# Patient Record
Sex: Female | Born: 1965 | Race: White | Hispanic: No | Smoking: Never smoker
Health system: Southern US, Community
[De-identification: ages and names within clinical notes are randomized; demographics above are authoritative.]

## PROBLEM LIST (undated history)

## (undated) DIAGNOSIS — O24419 Gestational diabetes mellitus in pregnancy, unspecified control: Secondary | ICD-10-CM

## (undated) DIAGNOSIS — I10 Essential (primary) hypertension: Secondary | ICD-10-CM

## (undated) DIAGNOSIS — R7301 Impaired fasting glucose: Secondary | ICD-10-CM

## (undated) DIAGNOSIS — F419 Anxiety disorder, unspecified: Secondary | ICD-10-CM

## (undated) DIAGNOSIS — E785 Hyperlipidemia, unspecified: Secondary | ICD-10-CM

## (undated) DIAGNOSIS — F32A Depression, unspecified: Secondary | ICD-10-CM

## (undated) DIAGNOSIS — T7840XA Allergy, unspecified, initial encounter: Secondary | ICD-10-CM

## (undated) DIAGNOSIS — F329 Major depressive disorder, single episode, unspecified: Secondary | ICD-10-CM

## (undated) HISTORY — DX: Allergy, unspecified, initial encounter: T78.40XA

## (undated) HISTORY — DX: Impaired fasting glucose: R73.01

## (undated) HISTORY — DX: Anxiety disorder, unspecified: F41.9

## (undated) HISTORY — DX: Depression, unspecified: F32.A

## (undated) HISTORY — DX: Gestational diabetes mellitus in pregnancy, unspecified control: O24.419

## (undated) HISTORY — DX: Hyperlipidemia, unspecified: E78.5

## (undated) HISTORY — PX: BLADDER SUSPENSION: SHX72

## (undated) HISTORY — DX: Essential (primary) hypertension: I10

## (undated) HISTORY — DX: Major depressive disorder, single episode, unspecified: F32.9

---

## 1987-02-18 HISTORY — PX: OTHER SURGICAL HISTORY: SHX169

## 2006-01-30 ENCOUNTER — Ambulatory Visit (HOSPITAL_COMMUNITY): Admission: RE | Admit: 2006-01-30 | Discharge: 2006-01-30 | Payer: Self-pay | Admitting: Family Medicine

## 2007-02-08 ENCOUNTER — Ambulatory Visit (HOSPITAL_COMMUNITY): Admission: RE | Admit: 2007-02-08 | Discharge: 2007-02-08 | Payer: Self-pay | Admitting: Family Medicine

## 2008-03-06 ENCOUNTER — Ambulatory Visit (HOSPITAL_COMMUNITY): Admission: RE | Admit: 2008-03-06 | Discharge: 2008-03-06 | Payer: Self-pay | Admitting: Family Medicine

## 2009-04-13 ENCOUNTER — Encounter: Admission: RE | Admit: 2009-04-13 | Discharge: 2009-04-13 | Payer: Self-pay | Admitting: Family Medicine

## 2010-03-10 ENCOUNTER — Encounter: Payer: Self-pay | Admitting: Family Medicine

## 2010-03-14 ENCOUNTER — Other Ambulatory Visit: Payer: Self-pay

## 2010-03-14 DIAGNOSIS — Z1239 Encounter for other screening for malignant neoplasm of breast: Secondary | ICD-10-CM

## 2010-04-18 ENCOUNTER — Ambulatory Visit: Payer: Self-pay

## 2010-05-28 ENCOUNTER — Ambulatory Visit: Payer: Self-pay

## 2010-05-29 ENCOUNTER — Other Ambulatory Visit: Payer: Self-pay | Admitting: Family Medicine

## 2010-05-29 ENCOUNTER — Other Ambulatory Visit: Payer: Self-pay | Admitting: Obstetrics and Gynecology

## 2010-05-29 ENCOUNTER — Ambulatory Visit: Payer: Self-pay

## 2010-05-29 DIAGNOSIS — Z1239 Encounter for other screening for malignant neoplasm of breast: Secondary | ICD-10-CM

## 2010-05-31 ENCOUNTER — Ambulatory Visit
Admission: RE | Admit: 2010-05-31 | Discharge: 2010-05-31 | Disposition: A | Discharge status: Home / Self Care | Payer: BC Managed Care – PPO | Source: Ambulatory Visit

## 2010-05-31 DIAGNOSIS — Z1239 Encounter for other screening for malignant neoplasm of breast: Secondary | ICD-10-CM

## 2010-12-14 ENCOUNTER — Encounter: Payer: Self-pay | Admitting: *Deleted

## 2010-12-14 ENCOUNTER — Emergency Department (HOSPITAL_BASED_OUTPATIENT_CLINIC_OR_DEPARTMENT_OTHER)
Admission: EM | Admit: 2010-12-14 | Discharge: 2010-12-14 | Disposition: A | Discharge status: Home / Self Care | Payer: BC Managed Care – PPO | Attending: Emergency Medicine | Admitting: Emergency Medicine

## 2010-12-14 DIAGNOSIS — IMO0002 Reserved for concepts with insufficient information to code with codable children: Secondary | ICD-10-CM | POA: Insufficient documentation

## 2010-12-14 DIAGNOSIS — X503XXA Overexertion from repetitive movements, initial encounter: Secondary | ICD-10-CM | POA: Insufficient documentation

## 2010-12-14 DIAGNOSIS — M5416 Radiculopathy, lumbar region: Secondary | ICD-10-CM

## 2010-12-14 DIAGNOSIS — S335XXA Sprain of ligaments of lumbar spine, initial encounter: Secondary | ICD-10-CM | POA: Insufficient documentation

## 2010-12-14 DIAGNOSIS — S39012A Strain of muscle, fascia and tendon of lower back, initial encounter: Secondary | ICD-10-CM

## 2010-12-14 DIAGNOSIS — J45909 Unspecified asthma, uncomplicated: Secondary | ICD-10-CM | POA: Insufficient documentation

## 2010-12-14 MED ORDER — OXYCODONE-ACETAMINOPHEN 5-325 MG PO TABS
1.0000 | ORAL_TABLET | Freq: Once | ORAL | Status: AC
  Administered 2010-12-14: 1 via ORAL
  Filled 2010-12-14: qty 1

## 2010-12-14 MED ORDER — OXYCODONE-ACETAMINOPHEN 5-325 MG PO TABS: ORAL_TABLET | ORAL | Status: DC

## 2010-12-14 MED ORDER — PREDNISONE 50 MG PO TABS: 50.0000 mg | ORAL_TABLET | Freq: Every day | ORAL | Status: AC

## 2010-12-14 MED ORDER — PREDNISONE 50 MG PO TABS
60.0000 mg | ORAL_TABLET | Freq: Once | ORAL | Status: AC
  Administered 2010-12-14: 60 mg via ORAL
  Filled 2010-12-14: qty 1

## 2010-12-14 NOTE — ED Provider Notes (Signed)
History     CSN: [id number] Arrival date & time: 12/14/2010  6:54 PM   First MD Initiated Contact with Patient 12/14/10 1915      Chief Complaint  Patient presents with  . Back Pain    (Consider location/radiation/quality/duration/timing/severity/associated sxs/prior treatment) HPI Comments: Pt injured her lower back ~ 6 weeks ago.  She went through therapy and was feeling much better./  She was lifting and pushing heavy objects around at home and has had acute lower back pain since then.  Patient is a 45 y.o. female presenting with back pain. The history is provided by the patient. No language interpreter was used.  Back Pain  This is a new problem. The current episode started 6 to 12 hours ago. The problem occurs constantly. The problem has not changed since onset.The pain is associated with lifting heavy objects. The pain is present in the lumbar spine. The quality of the pain is described as aching and shooting. Radiates to: R buttock. The pain is severe. The symptoms are aggravated by bending, twisting and certain positions. She has tried NSAIDs, muscle relaxants and analgesics for the symptoms. The treatment provided mild relief.    Past Medical History  Diagnosis Date  . Asthma     Past Surgical History  Procedure Date  . Bladder suspension     No family history on file.  History  Substance Use Topics  . Smoking status: Never Smoker   . Smokeless tobacco: Not on file  . Alcohol Use: Yes     rare    OB History    Grav Para Term Preterm Abortions TAB SAB Ect Mult Living                  Review of Systems  Musculoskeletal: Positive for back pain.  All other systems reviewed and are negative.    Allergies  Darvocet and Penicillins cross reactors  Home Medications   Current Outpatient Rx  Name Route Sig Dispense Refill  . ASCORBIC ACID 250 MG PO CHEW Oral Chew 250 mg by mouth daily.      . FENOFIBRATE 150 MG PO CAPS Oral Take 1 capsule by mouth daily.       Marland Kitchen HYDROCODONE-ACETAMINOPHEN PO Oral Take 1 tablet by mouth every 6 (six) hours as needed. For pain     . TORADOL IJ Injection Inject as directed once. Received at urgent care today     . LOSARTAN POTASSIUM-HCTZ 50-12.5 MG PO TABS Oral Take 1 tablet by mouth daily.      . MELOXICAM 15 MG PO TABS Oral Take 15 mg by mouth daily as needed. For pain     . METFORMIN HCL ER (MOD) 500 MG PO TB24 Oral Take 500 mg by mouth daily with breakfast.      . ONE-DAILY MULTI VITAMINS PO TABS Oral Take 1 tablet by mouth daily.      . VENLAFAXINE HCL 75 MG PO TABS Oral Take 225 mg by mouth daily.      Marland Kitchen VITAMIN D (ERGOCALCIFEROL) 50000 UNITS PO CAPS Oral Take 50,000 Units by mouth 2 (two) times a week. Take on Tuesday and Friday       BP 188/94  Pulse 117  Temp(Src) 98.4 F (36.9 C) (Oral)  Resp 22  Ht 5\' 2"  (1.575 m)  Wt 150 lb (68.04 kg)  BMI 27.44 kg/m2  SpO2 100%  LMP 11/24/2010  Physical Exam  Nursing note and vitals reviewed. Constitutional: She is oriented to person,  place, and time. She appears well-developed and well-nourished. No distress.  HENT:  Head: Normocephalic and atraumatic.  Eyes: EOM are normal.  Neck: Normal range of motion.  Cardiovascular: Normal rate, regular rhythm and normal heart sounds.   Pulmonary/Chest: Effort normal and breath sounds normal.  Abdominal: Soft. She exhibits no distension. There is no tenderness.  Musculoskeletal:       Lumbar back: She exhibits decreased range of motion and pain. She exhibits no tenderness, no bony tenderness, no swelling, no edema, no deformity, no laceration and no spasm.       Back:  Neurological: She is alert and oriented to person, place, and time.  Skin: Skin is warm and dry.  Psychiatric: She has a normal mood and affect. Judgment normal.    ED Course  Procedures (including critical care time)  Labs Reviewed - No data to display No results found.   No diagnosis found.    MDM          Worthy Rancher,  PA 12/14/10 2224

## 2010-12-14 NOTE — ED Notes (Signed)
Hx of back problems- lifted today and reinjured back- went to urgent care and got toradol shot without relief- alsao took hydrocodone and muscle relaxer without relief

## 2010-12-14 NOTE — ED Provider Notes (Signed)
Medical screening examination/treatment/procedure(s) were performed by non-physician practitioner and as supervising physician I was immediately available for consultation/collaboration.  Merced Hanners, MD 12/14/10 2356 

## 2011-05-20 ENCOUNTER — Other Ambulatory Visit: Payer: Self-pay | Admitting: Family Medicine

## 2011-05-20 DIAGNOSIS — Z1231 Encounter for screening mammogram for malignant neoplasm of breast: Secondary | ICD-10-CM

## 2011-06-02 ENCOUNTER — Ambulatory Visit
Admission: RE | Admit: 2011-06-02 | Discharge: 2011-06-02 | Disposition: A | Discharge status: Home / Self Care | Payer: BC Managed Care – PPO | Source: Ambulatory Visit | Attending: Family Medicine | Admitting: Family Medicine

## 2011-06-02 DIAGNOSIS — Z1231 Encounter for screening mammogram for malignant neoplasm of breast: Secondary | ICD-10-CM

## 2011-09-13 ENCOUNTER — Emergency Department (HOSPITAL_BASED_OUTPATIENT_CLINIC_OR_DEPARTMENT_OTHER)
Admission: EM | Admit: 2011-09-13 | Discharge: 2011-09-13 | Disposition: A | Discharge status: Home / Self Care | Payer: BC Managed Care – PPO | Attending: Emergency Medicine | Admitting: Emergency Medicine

## 2011-09-13 ENCOUNTER — Encounter (HOSPITAL_BASED_OUTPATIENT_CLINIC_OR_DEPARTMENT_OTHER): Payer: Self-pay | Admitting: *Deleted

## 2011-09-13 DIAGNOSIS — T63481A Toxic effect of venom of other arthropod, accidental (unintentional), initial encounter: Secondary | ICD-10-CM | POA: Insufficient documentation

## 2011-09-13 DIAGNOSIS — T6391XA Toxic effect of contact with unspecified venomous animal, accidental (unintentional), initial encounter: Secondary | ICD-10-CM | POA: Insufficient documentation

## 2011-09-13 NOTE — ED Provider Notes (Signed)
History     CSN: [id number]  Arrival date & time 09/13/11  5784   First MD Initiated Contact with Patient 09/13/11 2021      Chief Complaint  Patient presents with  . Insect Bite    (Consider location/radiation/quality/duration/timing/severity/associated sxs/prior treatment) The history is provided by the patient.   Patient presents to emergency department complaining of unknown insect sting to her posterior right thigh just prior to arrival. Patient states that she was walking through the grass when she felt a burning sharp pain in the posterior aspect of the right side. Patient states that she went inside and saw a red circular area of redness. Patient states her concern was "what if a black widow spider or a brown recluse spider bit me? How would I know?" Patient did not see the actual insect that bit her. Patient has no  history of systemic allergic reactions to insect stings. Patient took Benadryl prior to arrival with some improvement and he stinging pain. Symptoms are acute onset, ongoing, and improved. She denies aggravating factors.  Past Medical History  Diagnosis Date  . Asthma     Past Surgical History  Procedure Date  . Bladder suspension     History reviewed. No pertinent family history.  History  Substance Use Topics  . Smoking status: Never Smoker   . Smokeless tobacco: Not on file  . Alcohol Use: Yes     rare    OB History    Grav Para Term Preterm Abortions TAB SAB Ect Mult Living                  Review of Systems  All other systems reviewed and are negative.    Allergies  Darvocet and Penicillins cross reactors  Home Medications   Current Outpatient Rx  Name Route Sig Dispense Refill  . ASCORBIC ACID 250 MG PO CHEW Oral Chew 250 mg by mouth daily.      . FENOFIBRATE 150 MG PO CAPS Oral Take 1 capsule by mouth daily.      Marland Kitchen HYDROCODONE-ACETAMINOPHEN PO Oral Take 1 tablet by mouth every 6 (six) hours as needed. For pain     . TORADOL IJ  Injection Inject as directed once. Received at urgent care today     . LOSARTAN POTASSIUM-HCTZ 50-12.5 MG PO TABS Oral Take 1 tablet by mouth daily.      . MELOXICAM 15 MG PO TABS Oral Take 15 mg by mouth daily as needed. For pain     . METFORMIN HCL ER (MOD) 500 MG PO TB24 Oral Take 500 mg by mouth daily with breakfast.      . ONE-DAILY MULTI VITAMINS PO TABS Oral Take 1 tablet by mouth daily.      . OXYCODONE-ACETAMINOPHEN 5-325 MG PO TABS  One tab po q 4-6 hrs prnpain 20 tablet 0  . VENLAFAXINE HCL 75 MG PO TABS Oral Take 225 mg by mouth daily.      Marland Kitchen VITAMIN D (ERGOCALCIFEROL) 50000 UNITS PO CAPS Oral Take 50,000 Units by mouth 2 (two) times a week. Take on Tuesday and Friday       BP 131/74  Pulse 76  Temp 98.8 F (37.1 C) (Oral)  Resp 18  Ht 5\' 2"  (1.575 m)  Wt 150 lb (68.04 kg)  BMI 27.44 kg/m2  SpO2 99%  LMP 08/21/2011  Physical Exam  Constitutional: She is oriented to person, place, and time. She appears well-developed and well-nourished. No distress.  HENT:  Head: Normocephalic and atraumatic.       No facial swelling. Patent airway.   Eyes: Conjunctivae are normal.  Cardiovascular: Normal rate and regular rhythm.   Pulmonary/Chest: Effort normal. No respiratory distress. She has no wheezes. She has no rales. She exhibits no tenderness.  Musculoskeletal: Normal range of motion. She exhibits no edema and no tenderness.       Right ankle: She exhibits swelling. tenderness.       Swelling and TTP of right lateral ankle but no TTP or swelling of fore foot or calf. No break in skin. Good pedal pulse and cap refill of all toes. Wiggling toes without difficulty.   Neurological: She is alert and oriented to person, place, and time.       Normal sensation of entire foot.   Skin: Skin is warm and dry. No rash noted. She is not diaphoretic. There is erythema. No pallor.       Quarter sized area of faint erythema of posterior thigh with no break in skin or visible stinger. No  induration or fluctuance.   Psychiatric: She has a normal mood and affect. Her behavior is normal.    ED Course  Procedures (including critical care time)  Labs Reviewed - No data to display No results found.   1. Insect sting       MDM  Localized skin reaction to unknown insect sting with no systemic allergic reaction. Spoke at length with patient about changing or worsening symptoms to include signs of infection versus systemic allergic reaction and reasons to return to the emergency department. Patient voices her understanding and is agreeable plan.  I reassured patient of the low likelihood of a brown recluse spider or black widow to crawl up her leg when walking in open grass, bite her and drop without visualizing spider but gave strict precautions for return for worsening of symptoms.         [place], [place] 09/13/11 2051

## 2011-09-13 NOTE — ED Notes (Signed)
Pt states she was walking and felt a sharp pain in her right thigh area. Red circular area (approx 6 cm in diameter) noted.

## 2011-09-13 NOTE — ED Notes (Signed)
Quarter sized area of redness noted on the R hamstring where Pt. Reports she may have been bitten or stung by an insect today.the patient. Reports she was walking outside of the house and she has multiple spiders outside where she was walking.  Pt. Reports not seeing the insect that poss. Bit her.

## 2011-09-14 NOTE — ED Provider Notes (Signed)
Medical screening examination/treatment/procedure(s) were performed by non-physician practitioner and as supervising physician I was immediately available for consultation/collaboration.  Darien Kading, MD 09/14/11 0856 

## 2012-05-17 ENCOUNTER — Encounter: Payer: Self-pay | Admitting: Family Medicine

## 2012-05-17 ENCOUNTER — Ambulatory Visit (INDEPENDENT_AMBULATORY_CARE_PROVIDER_SITE_OTHER): Payer: BC Managed Care – PPO | Admitting: Family Medicine

## 2012-05-17 VITALS — BP 145/84 | HR 65 | Ht 62.5 in | Wt 153.0 lb

## 2012-05-17 DIAGNOSIS — E785 Hyperlipidemia, unspecified: Secondary | ICD-10-CM | POA: Insufficient documentation

## 2012-05-17 DIAGNOSIS — F32A Depression, unspecified: Secondary | ICD-10-CM

## 2012-05-17 DIAGNOSIS — F329 Major depressive disorder, single episode, unspecified: Secondary | ICD-10-CM

## 2012-05-17 DIAGNOSIS — R41840 Attention and concentration deficit: Secondary | ICD-10-CM

## 2012-05-17 DIAGNOSIS — F3289 Other specified depressive episodes: Secondary | ICD-10-CM

## 2012-05-17 DIAGNOSIS — R7301 Impaired fasting glucose: Secondary | ICD-10-CM

## 2012-05-17 DIAGNOSIS — I10 Essential (primary) hypertension: Secondary | ICD-10-CM | POA: Insufficient documentation

## 2012-05-17 MED ORDER — OMEGA-3-ACID ETHYL ESTERS 1 G PO CAPS: 2.0000 g | ORAL_CAPSULE | Freq: Two times a day (BID) | ORAL | Status: DC

## 2012-05-17 MED ORDER — PRAVASTATIN SODIUM 20 MG PO TABS: 20.0000 mg | ORAL_TABLET | Freq: Every day | ORAL | Status: DC

## 2012-05-17 MED ORDER — FENOFIBRATE 120 MG PO TABS: 1.0000 | ORAL_TABLET | Freq: Every day | ORAL | Status: DC

## 2012-05-17 MED ORDER — METHYLPHENIDATE HCL ER (OSM) 27 MG PO TBCR: 27.0000 mg | EXTENDED_RELEASE_TABLET | Freq: Every day | ORAL | Status: DC

## 2012-05-17 MED ORDER — LOSARTAN POTASSIUM-HCTZ 50-12.5 MG PO TABS: 1.0000 | ORAL_TABLET | Freq: Every day | ORAL | Status: DC

## 2012-05-17 MED ORDER — VITAMIN D (ERGOCALCIFEROL) 1.25 MG (50000 UNIT) PO CAPS: ORAL_CAPSULE | ORAL | Status: DC

## 2012-05-17 NOTE — Progress Notes (Signed)
  1)  Subjective:    Patient ID: Melanie Webb, female    DOB: 1965/10/16, 47 y.o.   MRN: [id number]  HPI :  Melanie Webb is here today to go over her lab and for medication refills.  She became more depressed after her last visit so went ahead and started taking the Prozac prescription she had been given.  Overall, she feels that her mood is improved but at times she feels that she has more of a "whatever" attitude.  She feels that she needs to have more happiness in her life.  She has just joined a gym and is hoping that exercise will make her feel better.  She also feels that she has been having trouble with her memory and concentration which concerns her.      Review of Systems  Psychiatric/Behavioral: Positive for dysphoric mood and decreased concentration.       Objective:   Physical Exam  Constitutional: She appears well-nourished. She appears distressed (appear to be mild ).  Neck: No thyromegaly present.  Cardiovascular: Normal rate, regular rhythm and normal heart sounds.   Pulmonary/Chest: Effort normal and breath sounds normal.  Psychiatric: Her behavior is normal. Judgment and thought content normal.  She appears to be down and was tearful at times.          Assessment & Plan:    1)  Depression/Concentration:  We had a long discussion about this and how we could best treat the combination.  One option was to add Wellbutrin to the Prozac.  We looking back and noted that Melanie Webb has taking Wellbutrin in the past.  She did not have favorable results with it and recalls that it made her a little "mean".   We decided that since a bigger issue is her concentration, we'll try her on a combination of Concerta and Prozac.  To decrease that "whatever" attitude, she is going to take her Prozac every other day.    2)  Hyperlipidemia:  Her lipid panel is perfect on the combination of pravastatin, Lovaza and Fenoglide.    3)  Hypertension:  Her BP is a little elevated today but she is  out of her Hyzaar.    4)  Impaired Fasting Glucose:  Her FBS is WNL on metformin.

## 2012-05-17 NOTE — Patient Instructions (Addendum)
1)  Mood - Take your Prozac daily - every other day.  Depending on your mood, you may want to take more during the second 1/2 of your cycle when PMS is worse.    2)  Concentration:  Take the Concerta 27 mg in the am.      Calorie Counting Diet A calorie counting diet requires you to eat the number of calories that are right for you in a day. Calories are the measurement of how much energy you get from the food you eat. Eating the right amount of calories is important for staying at a healthy weight. If you eat too many calories, your body will store them as fat and you may gain weight. If you eat too few calories, you may lose weight. Counting the number of calories you eat during a day will help you know if you are eating the right amount. A Registered Dietitian can determine how many calories you need in a day. The amount of calories needed varies from person to person. If your goal is to lose weight, you will need to eat fewer calories. Losing weight can benefit you if you are overweight or have health problems such as heart disease, high blood pressure, or diabetes. If your goal is to gain weight, you will need to eat more calories. Gaining weight may be necessary if you have a certain health problem that causes your body to need more energy. TIPS Whether you are increasing or decreasing the number of calories you eat during a day, it may be hard to get used to changes in what you eat and drink. The following are tips to help you keep track of the number of calories you eat.  Measure foods at home with measuring cups. This helps you know the amount of food and number of calories you are eating.  Restaurants often serve food in amounts that are larger than 1 serving. While eating out, estimate how many servings of a food you are given. For example, a serving of cooked rice is  cup or about the size of half of a fist. Knowing serving sizes will help you be aware of how much food you are eating at  restaurants.  Ask for smaller portion sizes or child-size portions at restaurants.  Plan to eat half of a meal at a restaurant. Take the rest home or share the other half with a friend.  Read the Nutrition Facts panel on food labels for calorie content and serving size. You can find out how many servings are in a package, the size of a serving, and the number of calories each serving has.  For example, a package might contain 3 cookies. The Nutrition Facts panel on that package says that 1 serving is 1 cookie. Below that, it will say there are 3 servings in the container. The calories section of the Nutrition Facts label says there are 90 calories. This means there are 90 calories in 1 cookie (1 serving). If you eat 1 cookie you have eaten 90 calories. If you eat all 3 cookies, you have eaten 270 calories (3 servings x 90 calories = 270 calories). The list below tells you how big or small some common portion sizes are.  1 oz.........4 stacked dice.  3 oz........Marland KitchenDeck of cards.  1 tsp.......Marland KitchenTip of little finger.  1 tbs......Marland KitchenMarland KitchenThumb.  2 tbs.......Marland KitchenGolf ball.   cup......Marland KitchenHalf of a fist.  1 cup.......Marland KitchenA fist. KEEP A FOOD LOG Write down every food item you eat,  the amount you eat, and the number of calories in each food you eat during the day. At the end of the day, you can add up the total number of calories you have eaten. It may help to keep a list like the one below. Find out the calorie information by reading the Nutrition Facts panel on food labels. Breakfast  Bran cereal (1 cup, 110 calories).  Fat-free milk ( cup, 45 calories). Snack  Apple (1 medium, 80 calories). Lunch  Spinach (1 cup, 20 calories).  Tomato ( medium, 20 calories).  Chicken breast strips (3 oz, 165 calories).  Shredded cheddar cheese ( cup, 110 calories).  Light [place] dressing (2 tbs, 60 calories).  Whole-wheat bread (1 slice, 80 calories).  Tub margarine (1 tsp, 35 calories).  Vegetable  soup (1 cup, 160 calories). Dinner  Pork chop (3 oz, 190 calories).  Brown rice (1 cup, 215 calories).  Steamed broccoli ( cup, 20 calories).  Strawberries (1  cup, 65 calories).  Whipped cream (1 tbs, 50 calories). Daily Calorie Total: 1425 Document Released: 02/03/2005 Document Revised: 04/28/2011 Document Reviewed: 07/31/2006 [facility] Patient Information 2013 [place], [place].  Exercise to Lose Weight Exercise and a healthy diet may help you lose weight. Your doctor may suggest specific exercises. EXERCISE IDEAS AND TIPS  Choose low-cost things you enjoy doing, such as walking, bicycling, or exercising to workout videos.  Take stairs instead of the elevator.  Walk during your lunch break.  Park your car further away from work or school.  Go to a gym or an exercise class.  Start with 5 to 10 minutes of exercise each day. Build up to 30 minutes of exercise 4 to 6 days a week.  Wear shoes with good support and comfortable clothes.  Stretch before and after working out.  Work out until you breathe harder and your heart beats faster.  Drink extra water when you exercise.  Do not do so much that you hurt yourself, feel dizzy, or get very short of breath. Exercises that burn about 150 calories:  Running 1  miles in 15 minutes.  Playing volleyball for 45 to 60 minutes.  Washing and waxing a car for 45 to 60 minutes.  Playing touch football for 45 minutes.  Walking 1  miles in 35 minutes.  Pushing a stroller 1  miles in 30 minutes.  Playing basketball for 30 minutes.  Raking leaves for 30 minutes.  Bicycling 5 miles in 30 minutes.  Walking 2 miles in 30 minutes.  Dancing for 30 minutes.  Shoveling snow for 15 minutes.  Swimming laps for 20 minutes.  Walking up stairs for 15 minutes.  Bicycling 4 miles in 15 minutes.  Gardening for 30 to 45 minutes.  Jumping rope for 15 minutes.  Washing windows or floors for 45 to 60 minutes. Document  Released: 03/08/2010 Document Revised: 04/28/2011 Document Reviewed: 03/08/2010 [facility] Patient Information 2013 [place], [place].

## 2012-05-19 DIAGNOSIS — R7301 Impaired fasting glucose: Secondary | ICD-10-CM | POA: Insufficient documentation

## 2012-05-24 ENCOUNTER — Other Ambulatory Visit: Payer: Self-pay | Admitting: Family Medicine

## 2012-05-24 ENCOUNTER — Telehealth: Payer: Self-pay | Admitting: *Deleted

## 2012-05-24 DIAGNOSIS — E559 Vitamin D deficiency, unspecified: Secondary | ICD-10-CM

## 2012-05-24 DIAGNOSIS — I10 Essential (primary) hypertension: Secondary | ICD-10-CM

## 2012-05-24 DIAGNOSIS — E785 Hyperlipidemia, unspecified: Secondary | ICD-10-CM

## 2012-05-24 MED ORDER — VITAMIN D (ERGOCALCIFEROL) 1.25 MG (50000 UNIT) PO CAPS: ORAL_CAPSULE | ORAL | Status: DC

## 2012-05-24 MED ORDER — OMEGA-3-ACID ETHYL ESTERS 1 G PO CAPS: 2.0000 g | ORAL_CAPSULE | Freq: Two times a day (BID) | ORAL | Status: DC

## 2012-05-24 MED ORDER — PRAVASTATIN SODIUM 20 MG PO TABS: 20.0000 mg | ORAL_TABLET | Freq: Every day | ORAL | Status: DC

## 2012-05-24 MED ORDER — LOSARTAN POTASSIUM-HCTZ 50-12.5 MG PO TABS: 1.0000 | ORAL_TABLET | Freq: Every day | ORAL | Status: DC

## 2012-05-24 MED ORDER — FENOFIBRATE 120 MG PO TABS: 1.0000 | ORAL_TABLET | Freq: Every day | ORAL | Status: DC

## 2012-05-24 NOTE — Telephone Encounter (Signed)
PT CALLED SAID SHE SEEN DR. ON [address] AND HER RX'S ARE NOT AT THE PHARMACY- PHARMACY USED [facility] [address]. HIGH BLOOD PRESSURE MED., METPHORMINE, PREVASTATIN. NEEDS REFILLS PT BELIEVES RX FOR VIT D AND ANOTHER ONE TOO. PT ALSO HAS A MOLE SHE WANTS LOOKED AT DOES SHE NEED TO SCHED  WITH DR. OR NEED TO GO TO DERMATOLOGIST?   CALL PT . THANKS Chrisoula Zegarra

## 2012-05-24 NOTE — Telephone Encounter (Signed)
Routed to Dr Alberteen Sam to re-order all her meds. PG

## 2012-05-31 ENCOUNTER — Telehealth: Payer: Self-pay | Admitting: *Deleted

## 2012-05-31 NOTE — Telephone Encounter (Signed)
PT SAID THAT INS. DENIED THE PHARMACY TO BE ABLE TO DO HER REFILL FOR CONCERTA-  NEEDS FOR DR. ZANARD TO PRE AUTHORIZE RX? PHARMACY [facility] [address].

## 2012-06-03 ENCOUNTER — Ambulatory Visit: Payer: BC Managed Care – PPO | Admitting: Family Medicine

## 2012-06-10 NOTE — Telephone Encounter (Signed)
Her concerta was approved until 05/2014. PG

## 2012-06-30 ENCOUNTER — Other Ambulatory Visit: Payer: Self-pay

## 2012-06-30 DIAGNOSIS — Z1231 Encounter for screening mammogram for malignant neoplasm of breast: Secondary | ICD-10-CM

## 2012-07-22 ENCOUNTER — Ambulatory Visit
Admission: RE | Admit: 2012-07-22 | Discharge: 2012-07-22 | Disposition: A | Discharge status: Home / Self Care | Payer: BC Managed Care – PPO | Source: Ambulatory Visit

## 2012-07-22 DIAGNOSIS — Z1231 Encounter for screening mammogram for malignant neoplasm of breast: Secondary | ICD-10-CM

## 2012-08-17 ENCOUNTER — Telehealth: Payer: Self-pay | Admitting: Family Medicine

## 2012-08-17 NOTE — Telephone Encounter (Signed)
I checked with Elease Hashimoto and she stated that at this time we do not have any coupons. I called the patient and let her know.

## 2012-10-20 ENCOUNTER — Encounter: Payer: Self-pay | Admitting: Family Medicine

## 2012-10-20 ENCOUNTER — Ambulatory Visit (INDEPENDENT_AMBULATORY_CARE_PROVIDER_SITE_OTHER): Payer: BC Managed Care – PPO | Admitting: Family Medicine

## 2012-10-20 VITALS — BP 115/75 | HR 67 | Wt 150.0 lb

## 2012-10-20 DIAGNOSIS — E785 Hyperlipidemia, unspecified: Secondary | ICD-10-CM

## 2012-10-20 DIAGNOSIS — R41840 Attention and concentration deficit: Secondary | ICD-10-CM | POA: Insufficient documentation

## 2012-10-20 DIAGNOSIS — Z23 Encounter for immunization: Secondary | ICD-10-CM | POA: Insufficient documentation

## 2012-10-20 DIAGNOSIS — R5381 Other malaise: Secondary | ICD-10-CM

## 2012-10-20 DIAGNOSIS — E559 Vitamin D deficiency, unspecified: Secondary | ICD-10-CM

## 2012-10-20 DIAGNOSIS — R7301 Impaired fasting glucose: Secondary | ICD-10-CM | POA: Insufficient documentation

## 2012-10-20 DIAGNOSIS — F411 Generalized anxiety disorder: Secondary | ICD-10-CM

## 2012-10-20 LAB — POCT GLYCOSYLATED HEMOGLOBIN (HGB A1C): Hemoglobin A1C: 5.4

## 2012-10-20 MED ORDER — METHYLPHENIDATE HCL ER (OSM) 27 MG PO TBCR: 27.0000 mg | EXTENDED_RELEASE_TABLET | Freq: Every day | ORAL | Status: DC

## 2012-10-20 MED ORDER — METHYLPHENIDATE HCL ER (OSM) 18 MG PO TBCR: 18.0000 mg | EXTENDED_RELEASE_TABLET | Freq: Every day | ORAL | Status: DC

## 2012-10-20 MED ORDER — SIMVASTATIN 20 MG PO TABS: 20.0000 mg | ORAL_TABLET | Freq: Every day | ORAL | Status: DC

## 2012-10-20 MED ORDER — FLUOXETINE HCL 20 MG PO TABS: 20.0000 mg | ORAL_TABLET | Freq: Every day | ORAL | Status: DC

## 2012-10-20 MED ORDER — LISDEXAMFETAMINE DIMESYLATE 30 MG PO CAPS: 30.0000 mg | ORAL_CAPSULE | ORAL | Status: DC

## 2012-10-20 NOTE — Assessment & Plan Note (Signed)
Refilled her Prozac.   

## 2012-10-20 NOTE — Progress Notes (Signed)
  Subjective:    Patient ID: Melanie Webb, female    DOB: February 20, 1965, 47 y.o.   MRN: [id number]  HPI  Melanie Webb is here today to discuss the conditions listed below:   1)  Depression:  She is doing very well on her Prozac 20 mg and would like to continue on it.    2)  Vitamin D:  She has been taking Vitamin D 56213 IU 2-3 times per week.    3)  ADD:  She takes her Concerta 27 mg at least once per week and feels that this helps her with her concentration at school.  She needs a refill on it.  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  Her only complaint with the Concerta is that she thinks that it causes her to have a headache.    4)  IFG:  She is doing well on metformin and she would like to continue on it. .    Review of Systems  Constitutional: Negative.   HENT: Negative.   Eyes: Negative.   Respiratory: Negative.   Cardiovascular: Negative.   Gastrointestinal: Negative.   Endocrine: Negative.   Genitourinary: Negative.   Musculoskeletal: Negative.   Skin: Negative.   Allergic/Immunologic: Negative.   Neurological: Negative.   Hematological: Negative.   Psychiatric/Behavioral: Negative.      Past Medical History  Diagnosis Date  . Asthma   . Depression   . Anxiety   . Hypertension   . Hyperlipidemia     Family History  Problem Relation Age of Onset  . Diabetes Mother   . Hyperlipidemia Mother   . Hypertension Mother         Objective:   Physical Exam  Vitals reviewed. Constitutional: She is oriented to person, place, and time. She appears well-developed and well-nourished.  Cardiovascular: Normal rate and regular rhythm.   Pulmonary/Chest: Effort normal and breath sounds normal.  Neurological: She is alert and oriented to person, place, and time.  Skin: Skin is warm and dry.  Psychiatric: She has a normal mood and affect.      Assessment & Plan:  .

## 2012-10-20 NOTE — Assessment & Plan Note (Signed)
She received a flu shot without difficulty.

## 2012-10-20 NOTE — Assessment & Plan Note (Signed)
She is going to compare Concerta 18 mg vs Vyvanse 30 mg.  We'll see if this keeps her from having headaches.

## 2012-10-20 NOTE — Assessment & Plan Note (Signed)
We'll recheck her Vitamin D level at her next blood draw to make sure that her level is not too high.

## 2012-10-20 NOTE — Assessment & Plan Note (Addendum)
Her A1c is perfect at 5.4%. She is going to continue on metformin.  She should have enough medication to carry her to November.

## 2012-10-20 NOTE — Patient Instructions (Addendum)
1)  ADD Medication : Compare Vyvanse 30 mg vs Concerta 18 mg to see which one works the best for you without giving you a headache.    Diabetes, Type 2, Am I At Risk? Diabetes is a lasting (chronic) disease. In type 2 diabetes, the pancreas does not make enough insulin, and the body does not respond normally to the insulin that is made. This type of diabetes was also previously called adult onset diabetes. About 90% of all those who have diabetes have type 2. It usually occurs after the age of 83, but can occur at any age.  People develop type 2 diabetes because they do not use insulin properly. Eventually, the pancreas cannot make enough insulin for the body's needs. Over time, the amount of glucose (sugar) in the blood increases. RISK FACTORS  Overweight  the more weight you have, the more resistant your cells become to insulin.  Family history  you are more likely to get diabetes if a parent or sibling has diabetes.  Race certain races get diabetes more.  African Americans.  American Indians.  Asian Americans.  Hispanics.  Pacific Islander.  Inactive exercise helps control weight and helps your cells be more sensitive to insulin.  Gestational diabetes  some women develop diabetes while they are pregnant. This goes away when they deliver. However, they are 50-60% more likely to develop type 2 diabetes at a later time.  Having a baby over 9 pounds  a sign that you may have had gestational diabetes.  Age the risk of diabetes goes up as you get older, especially after age 25.  High blood pressure (hypertension). SYMPTOMS Many people have no signs or symptoms. Symptoms can be so mild that you might not even notice them. Some of these signs are:  Increased thirst.  Increased hunger.  Tiredness (fatigue).  Increased urination, especially at night.  Weight loss.  Blurred vision.  Sores that do not heal. WHO SHOULD BE TESTED?  Anyone 45 years or older, especially if  overweight, should consider getting tested.  If you are younger than 45, overweight, and have one or more of the risk factors, you should consider getting tested. DIAGNOSIS  Fasting blood glucose (FBS). Usually, 2 are done.  FBS 101-125 mg/dl is considered pre-diabetes.  FBS 126 mg/dl or greater is considered diabetes.  2 hour Oral Glucose Tolerance Test (OGTT). This test is preformed by first having you not eat or drink for several hours. You are then given something sweet to drink and your blood glucose is measured fasting, at one hour and 2 hours. This test tells how well you are able to handle sugars or carbohydrates.  Fasting: 60-100 mg/dl.  1 hour: less than 200 mg/dl.  2 hours: less than 140 mg/dl.  A1c A1c is a blood glucose test that gives and average of your blood glucose over 3 months. It is the accepted method to use to diagnose diabetes.  A1c 5.7-6.4% is considered pre-diabetes.  A1c 6.5% or greater is considered diabetes. WHAT DOES IT MEAN TO HAVE PRE-DIABETES? Pre-diabetes means you are at risk for getting type 2 diabetes. Your blood glucose is higher than normal, but not yet high enough to diagnose diabetes. The good news is, if you have pre-diabetes you can reduce the risk of getting diabetes and even return to normal blood glucose levels. With modest weight loss and moderate physical activity, you can delay or prevent type 2 diabetes.  PREVENTION You cannot do anything about race, age or  family history, but you can lower your chances of getting diabetes. You can:   Exercise regularly and be active.  Reduce fat and calorie intake.  Make wise food choices as much as you can.  Reduce your intake of salt and alcohol.  Maintain a reasonable weight.  Keep blood pressure in an acceptable range. Take medication if needed.  Not smoke.  Maintain an acceptable cholesterol level (HDL, LDL, Triglycerides). Take medication if needed. DOING MY PART: GETTING  STARTED Making big changes in your life is hard, especially if you are faced with more than one change. You can make it easier by taking these steps:  Make a plan to change behavior.  Decide exactly what you will do and when you will do it.  Plan what you need to get ready.  Think about what might prevent you from reaching your goals.  Find family and friends who will support and encourage you.  Decide how you will reward yourself when you do what you have planned.  Your doctor, dietitian, or counselor can help you make a plan. HERE ARE SOME OF THE AREAS YOU MAY WISH TO CHANGE TO REDUCE YOUR RISK OF DIABETES. If you are overweight or obese, choose sensible ways to get in shape. Even small amounts of weight loss, like 5-10 pounds, can help reduce the effects of insulin resistance and help blood glucose control. Diet  Avoid crash diets. Instead, eat less of the foods you usually have. Limit the amount of fat you eat.  Increase your physical activity. Aim for at least 30 minutes of exercise most days of the week.  Set a reasonable weight-loss goal, such as losing 1 pound a week. Aim for a long-term goal of losing 5-7% of your total body weight.  Make wise food choices most of the time.  What you eat has a big impact on your health. By making wise food choices, you can help control your body weight, blood pressure, and cholesterol.  Take a hard look at the serving sizes of the foods you eat. Reduce serving sizes of meat, desserts, and foods high in fat. Increase your intake of fruits and vegetables.  Limit your fat intake to about 25% of your total calories. For example, if your food choices add up to about 2,000 calories a day, try to eat no more than 56 grams of fat. Your caregiver or a dietitian can help you figure out how much fat to have. You can check food labels for fat content too.  You may also want to reduce the number of calories you have each day.  Keep a food log. Write  down what you eat, how much you eat, and anything else that helps keep you on track.  When you meet your goal, reward yourself with a nonfood item or activity. Exercise  Be physically active every day.  Keep and exercise log. Write down what exercise you did, for how long, and anything else that keeps you on track.  Regular exercise (like brisk walking) tackles several risk factors at once. It helps you lose weight, it keeps your cholesterol and blood pressure under control, and it helps your body use insulin. People who are physically active for 30 minutes a day, 5 days a week, reduced their risk of type 2 diabetes. If you are not very active, you should start slowly at first. Talk with your caregiver first about what kinds of exercise would be safe for you. Make a plan to increase your  activity level with the goal of being active for at least 30 minutes a day, most days of the week.  Choose activities you enjoy. Here are some ways to work extra activity into your daily routine:  Take the stairs rather than an elevator or escalator.  Park at the far end of the lot and walk.  Get off the bus a few stops early and walk the rest of the way.  Walk or bicycle instead of drive whenever you can. Medications Some people need medication to help control their blood pressure or cholesterol levels. If you do, take your medicines as directed. Ask your caregiver whether there are any medicines you can take to prevent type 2 diabetes. Document Released: 02/06/2003 Document Revised: 04/28/2011 Document Reviewed: 11/01/2008 [facility] Patient Information 2014 [place], [place].

## 2013-01-03 ENCOUNTER — Other Ambulatory Visit: Payer: Self-pay | Admitting: *Deleted

## 2013-01-03 MED ORDER — METFORMIN HCL 500 MG PO TABS: 500.0000 mg | ORAL_TABLET | Freq: Two times a day (BID) | ORAL | Status: DC

## 2013-01-10 ENCOUNTER — Other Ambulatory Visit: Payer: Self-pay | Admitting: *Deleted

## 2013-01-10 ENCOUNTER — Other Ambulatory Visit: Payer: BC Managed Care – PPO

## 2013-01-10 DIAGNOSIS — R7301 Impaired fasting glucose: Secondary | ICD-10-CM

## 2013-01-10 DIAGNOSIS — E785 Hyperlipidemia, unspecified: Secondary | ICD-10-CM

## 2013-01-10 DIAGNOSIS — R5381 Other malaise: Secondary | ICD-10-CM

## 2013-01-11 ENCOUNTER — Other Ambulatory Visit: Payer: BC Managed Care – PPO

## 2013-01-12 ENCOUNTER — Other Ambulatory Visit: Payer: BC Managed Care – PPO

## 2013-01-12 LAB — CBC WITH DIFFERENTIAL/PLATELET
Basophils Absolute: 0 10*3/uL (ref 0.0–0.1)
Basophils Relative: 0 % (ref 0–1)
Eosinophils Absolute: 0.1 10*3/uL (ref 0.0–0.7)
Eosinophils Relative: 2 % (ref 0–5)
HCT: 38.2 % (ref 36.0–46.0)
Hemoglobin: 13 g/dL (ref 12.0–15.0)
Lymphocytes Relative: 26 % (ref 12–46)
Lymphs Abs: 1.8 10*3/uL (ref 0.7–4.0)
MCH: 29.5 pg (ref 26.0–34.0)
MCHC: 34 g/dL (ref 30.0–36.0)
MCV: 86.6 fL (ref 78.0–100.0)
Monocytes Absolute: 0.4 10*3/uL (ref 0.1–1.0)
Monocytes Relative: 6 % (ref 3–12)
Neutro Abs: 4.5 10*3/uL (ref 1.7–7.7)
Neutrophils Relative %: 66 % (ref 43–77)
Platelets: 306 10*3/uL (ref 150–400)
RBC: 4.41 MIL/uL (ref 3.87–5.11)
RDW: 14.2 % (ref 11.5–15.5)
WBC: 6.8 10*3/uL (ref 4.0–10.5)

## 2013-01-12 LAB — LIPID PANEL
Cholesterol: 157 mg/dL (ref 0–200)
HDL: 57 mg/dL (ref >39)
LDL Cholesterol: 80 mg/dL (ref 0–99)
Total CHOL/HDL Ratio: 2.8 Ratio
Triglycerides: 102 mg/dL (ref <150)
VLDL: 20 mg/dL (ref 0–40)

## 2013-01-12 LAB — COMPLETE METABOLIC PANEL WITH GFR
ALT: 13 U/L (ref 0–35)
AST: 18 U/L (ref 0–37)
Albumin: 4.4 g/dL (ref 3.5–5.2)
Alkaline Phosphatase: 43 U/L (ref 39–117)
BUN: 17 mg/dL (ref 6–23)
CO2: 23 mEq/L (ref 19–32)
Calcium: 9 mg/dL (ref 8.4–10.5)
Chloride: 106 mEq/L (ref 96–112)
Creat: 0.7 mg/dL (ref 0.50–1.10)
GFR, Est African American: >89 mL/min
GFR, Est Non African American: >89 mL/min
Glucose, Bld: 90 mg/dL (ref 70–99)
Potassium: 4.4 mEq/L (ref 3.5–5.3)
Sodium: 138 mEq/L (ref 135–145)
Total Bilirubin: 0.4 mg/dL (ref 0.3–1.2)
Total Protein: 6.8 g/dL (ref 6.0–8.3)

## 2013-01-12 LAB — TSH: TSH: 0.976 u[IU]/mL (ref 0.350–4.500)

## 2013-01-17 ENCOUNTER — Encounter: Payer: Self-pay | Admitting: Family Medicine

## 2013-01-17 ENCOUNTER — Encounter (INDEPENDENT_AMBULATORY_CARE_PROVIDER_SITE_OTHER): Payer: Self-pay

## 2013-01-17 ENCOUNTER — Ambulatory Visit (INDEPENDENT_AMBULATORY_CARE_PROVIDER_SITE_OTHER): Payer: BC Managed Care – PPO | Admitting: Family Medicine

## 2013-01-17 VITALS — BP 130/75 | HR 74 | Resp 16 | Ht 62.5 in | Wt 150.0 lb

## 2013-01-17 DIAGNOSIS — I10 Essential (primary) hypertension: Secondary | ICD-10-CM

## 2013-01-17 DIAGNOSIS — IMO0001 Reserved for inherently not codable concepts without codable children: Secondary | ICD-10-CM

## 2013-01-17 DIAGNOSIS — R41840 Attention and concentration deficit: Secondary | ICD-10-CM

## 2013-01-17 DIAGNOSIS — R7301 Impaired fasting glucose: Secondary | ICD-10-CM

## 2013-01-17 DIAGNOSIS — E785 Hyperlipidemia, unspecified: Secondary | ICD-10-CM

## 2013-01-17 DIAGNOSIS — F411 Generalized anxiety disorder: Secondary | ICD-10-CM

## 2013-01-17 LAB — POCT GLYCOSYLATED HEMOGLOBIN (HGB A1C): Hemoglobin A1C: 5.2

## 2013-01-17 MED ORDER — LOSARTAN POTASSIUM-HCTZ 50-12.5 MG PO TABS: 1.0000 | ORAL_TABLET | Freq: Every day | ORAL | Status: DC

## 2013-01-17 MED ORDER — LISDEXAMFETAMINE DIMESYLATE 30 MG PO CAPS: 30.0000 mg | ORAL_CAPSULE | Freq: Every day | ORAL | Status: DC

## 2013-01-17 MED ORDER — FLUOXETINE HCL 20 MG PO TABS: 20.0000 mg | ORAL_TABLET | Freq: Every day | ORAL | Status: DC

## 2013-01-17 NOTE — Patient Instructions (Addendum)
1)  Blood Sugar - Your A1c is great at 5.2% so you can stop the glucophage.  To use up what you have just take one on a day that you are eating more.     2)  Cholesterol - Stop the simvastatin and the Fenoglide.   3)  Mood - Stay on the Prozac and St. John's Wort for your mood.    4)  Vitamin D - Take 2 per week in the winter and 1 in the summer.  Complementary and Alternative Medical Therapies for Diabetes Complementary and alternative medicines are health care practices or products that are not always accepted as part of routine medicine. Complementary medicine is used along with routine medicine (medical therapy). Alternative medicine can sometimes be used instead of routine medicine. Some people use these methods to treat diabetes. While some of these therapies may be effective, others may not be. Some may even be harmful. Patients using these methods need to tell their caregiver. It is important to let your caregivers know what you are doing. Some of these therapies are discussed below. For more information, talk with your caregiver. THERAPIES Acupuncture Acupuncture is done by a professional who inserts needles into certain points on the skin. Some scientists believe that this triggers the release of the body's natural painkillers. It has been shown to relieve long-term (chronic) pain. This may help patients with painful nerve damage caused by diabetes. Biofeedback Biofeedback helps a person become more aware of the body's response to pain. It also helps you learn to deal with the pain. This alternative therapy focuses on relaxation and stress-reduction techniques. Thinking of peaceful mental images (guided imagery) is one technique. Some people believe these images can ease their condition. MEDICATIONS Chromium Several studies report that chromium supplements may improve diabetes control. Chromium helps insulin improve its action. Research is not yet certain. Supplements have not been  recommended or approved. Caution is needed if you have kidney (renal) problems. Ginseng There are several types of ginseng plants. American ginseng is used for diabetes studies. Those studies have shown some glucose-lowering effects. Those effects have been seen with fasting and after-meal blood glucose levels. They have also been seen in A1c levels (average blood glucose levels over a 78-month period). More long-term studies are needed before recommendations for use of ginseng can be made. Magnesium Experts have studied the relationship between magnesium and diabetes for many years. But it is not yet fully understood. Studies suggest that a low amount of magnesium may make blood glucose control worse in type 2 diabetes. Research also shows that a low amount may contribute to certain diabetes complications. One study showed that people who consume more magnesium had less risk of type 2 diabetes. Eating whole grains, nuts, and green leafy vegetables raises the magnesium level. Vanadium Vanadium is a compound found in tiny amounts in plants and animals. Early studies showed that vanadium improved blood glucose levels in animals with type 1 and type 2 diabetes. One study found that when given vanadium, those with diabetes were able to decrease their insulin dosage. Researchers still need to learn how it works in the body to discover any side effects, and to find safe dosages. Cinnamon There have been a couple of studies that seem to indicate cinnamon decreases insulin resistance and increases insulin production. By doing so, it may lower blood glucose. Exact doses are unknown, but it may work best when used in combination with other diabetes medicines. Document Released: 12/01/2006 Document Revised: 04/28/2011 Document  Reviewed: 12/14/2008 [employer] Patient Information 2014 [place], [place].

## 2013-01-17 NOTE — Progress Notes (Signed)
Subjective:    Patient ID: Melanie Webb, female    DOB: 1965/10/10, 47 y.o.   MRN: [id number]  HPI  Melanie Webb is here today to follow up on her labs and to discuss the conditions listed below:   1)  Depression: She is doing very well on her Prozac 20 mg and would like to continue on it.  She is needing a refill.   2)  Vitamin D: She has been taking Vitamin D 40981 IU 2-3 times per week.   3)  ADD: She is currently taking Concerta. She says that she prefers Vyvanse and would like to go back on it.    4)  IFG: She is doing well on metformin. Her A1C is now 5.2.  5)  Birth Control: She says that she does not want to take any a hormonal form of birth control because she says that they make her crazy. She is wanting to get a tubal and would like a referral to a GYN.   Review of Systems  Constitutional: Negative.   HENT: Negative.   Eyes: Negative.   Respiratory: Negative.   Cardiovascular: Negative.   Gastrointestinal: Negative.   Endocrine: Negative.   Genitourinary: Negative.   Musculoskeletal: Negative.   Skin: Negative.   Allergic/Immunologic: Negative.   Neurological: Negative.   Hematological: Negative.   Psychiatric/Behavioral: Negative.      Past Medical History  Diagnosis Date  . Asthma   . Depression   . Anxiety   . Hypertension   . Hyperlipidemia      Family History  Problem Relation Age of Onset  . Diabetes Mother   . Hyperlipidemia Mother   . Hypertension Mother      History   Social History Narrative   Mrital Status:  Divorced.    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    Tobacco Use/Exposure:  None    Alcohol Use:  Limited    Drug Use:  None   Diet:  Regular   Exercise:  None   Hobbies:  Sewing, Scrapbooking              Objective:   Physical  Exam  Nursing note and vitals reviewed. Constitutional: She is oriented to person, place, and time. She appears well-developed and well-nourished.  HENT:  Head: Normocephalic and atraumatic.  Right Ear: External ear normal.  Left Ear: External ear normal.  Eyes: Conjunctivae and EOM are normal. Pupils are equal, round, and reactive to light.  Neck: Normal range of motion.  Cardiovascular: Normal rate, regular rhythm, normal heart sounds and intact distal pulses.   Pulmonary/Chest: Effort normal and breath sounds normal.  Abdominal: Soft.  Musculoskeletal: Normal range of motion.  Neurological: She is alert and oriented to person, place, and time. She has normal reflexes.  Skin: Skin is warm and dry.  Psychiatric: She has a normal mood and affect. Her behavior is normal. Judgment and thought content normal.      Assessment & Plan:   Melanie Webb was seen today for medication management.  Diagnoses and associated orders for this visit:  Impaired fasting glucose Comments: Her sugar control is very well controlled so she is to hold on the metformin.   - POCT HgB A1C  Anxiety state, unspecified Comments: Refilled her Prozac.  -  FLUoxetine (PROZAC) 20 MG tablet; Take 1 tablet (20 mg total) by mouth daily.  Essential hypertension, benign - losartan-hydrochlorothiazide (HYZAAR) 50-12.5 MG per tablet; Take 1 tablet by mouth daily.  Attention/concentration deficit, non-ADHD Comments: We'll change her back to Vyvanse.    - lisdexamfetamine (VYVANSE) 30 MG capsule; Take 1 capsule (30 mg total) by mouth daily. (Refilled x 3 months)   Other and unspecified hyperlipidemia Comments: Since her lipid panel is so perfect, she is going to hold her cholesterol medications.    TIME SPENT "FACE TO FACE" WITH PATIENT -  45 MINS

## 2013-02-21 ENCOUNTER — Encounter: Payer: Self-pay | Admitting: Family Medicine

## 2013-03-23 ENCOUNTER — Other Ambulatory Visit: Payer: Self-pay | Admitting: Obstetrics and Gynecology

## 2013-03-23 DIAGNOSIS — N6310 Unspecified lump in the right breast, unspecified quadrant: Secondary | ICD-10-CM

## 2013-03-28 ENCOUNTER — Ambulatory Visit
Admission: RE | Admit: 2013-03-28 | Discharge: 2013-03-28 | Disposition: A | Discharge status: Home / Self Care | Payer: BC Managed Care – PPO | Source: Ambulatory Visit | Attending: Obstetrics and Gynecology | Admitting: Obstetrics and Gynecology

## 2013-03-28 ENCOUNTER — Ambulatory Visit
Admission: RE | Admit: 2013-03-28 | Discharge: 2013-03-28 | Disposition: A | Discharge status: Home / Self Care | Payer: Self-pay | Source: Ambulatory Visit | Attending: Obstetrics and Gynecology | Admitting: Obstetrics and Gynecology

## 2013-03-28 DIAGNOSIS — N6310 Unspecified lump in the right breast, unspecified quadrant: Secondary | ICD-10-CM

## 2013-04-05 DIAGNOSIS — I10 Essential (primary) hypertension: Secondary | ICD-10-CM | POA: Insufficient documentation

## 2013-04-05 DIAGNOSIS — IMO0001 Reserved for inherently not codable concepts without codable children: Secondary | ICD-10-CM | POA: Insufficient documentation

## 2013-05-09 IMAGING — MG MM DIGITAL SCREENING BILAT W/ CAD
4 series · 4 of 4 positions shown · non-contrast
Comparison: none

[R CC]
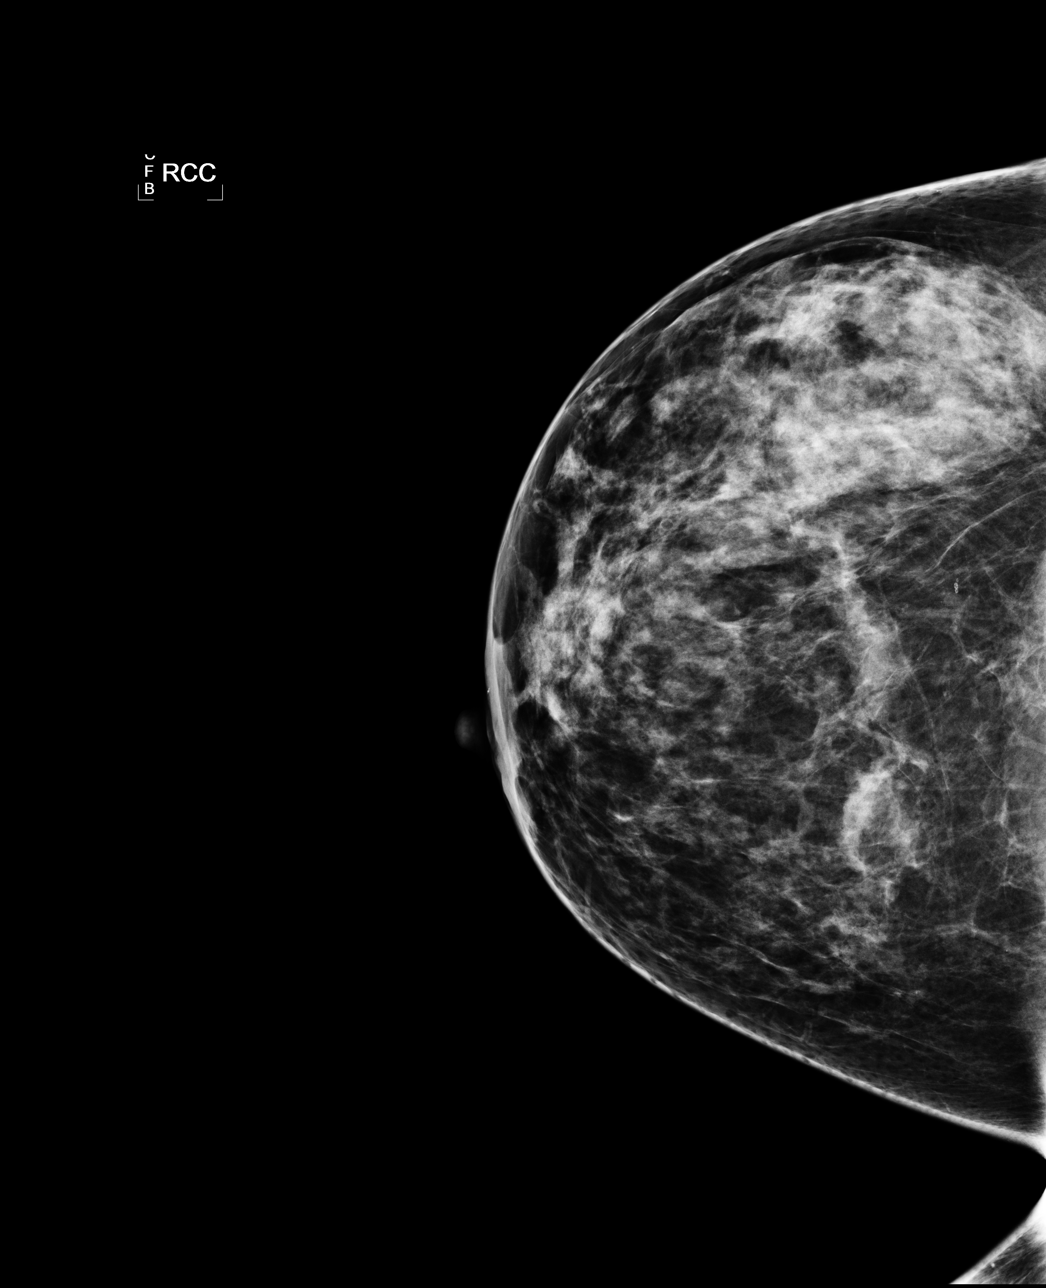

[L CC]
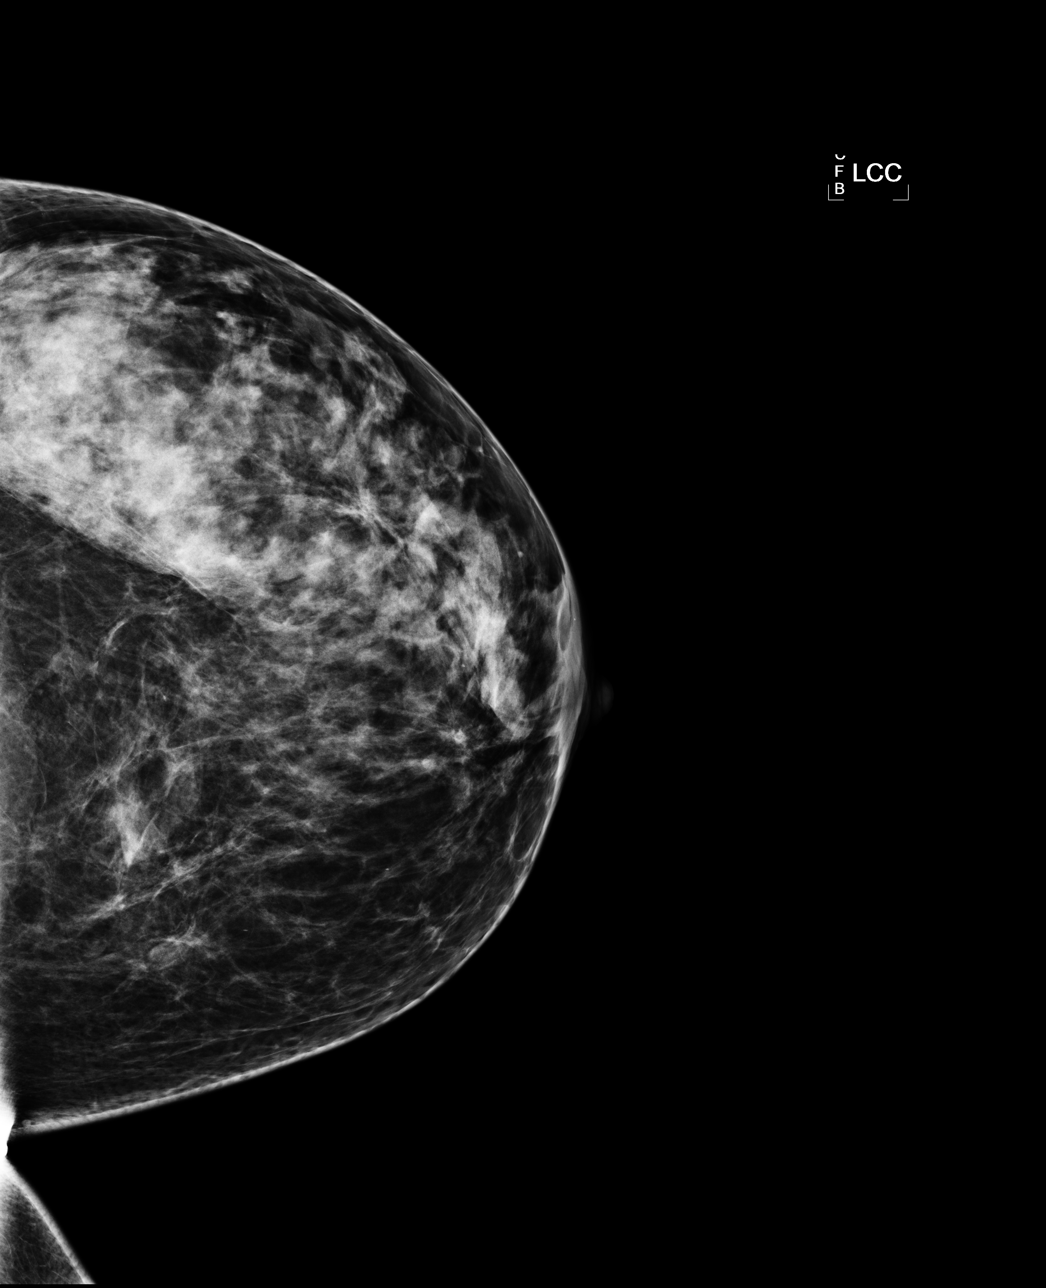

[L MLO]
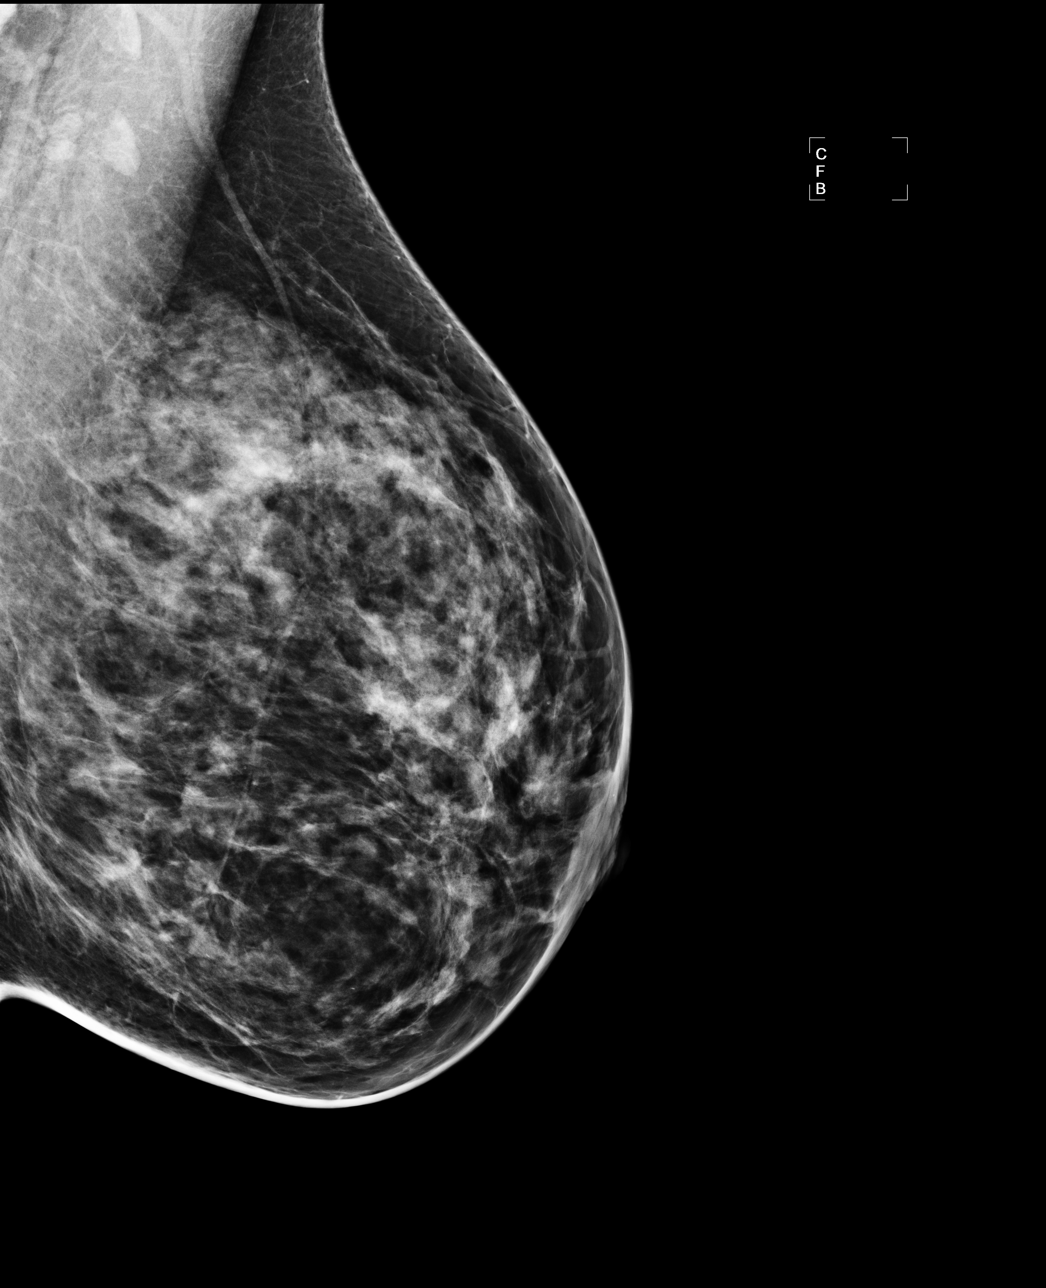

[R MLO]
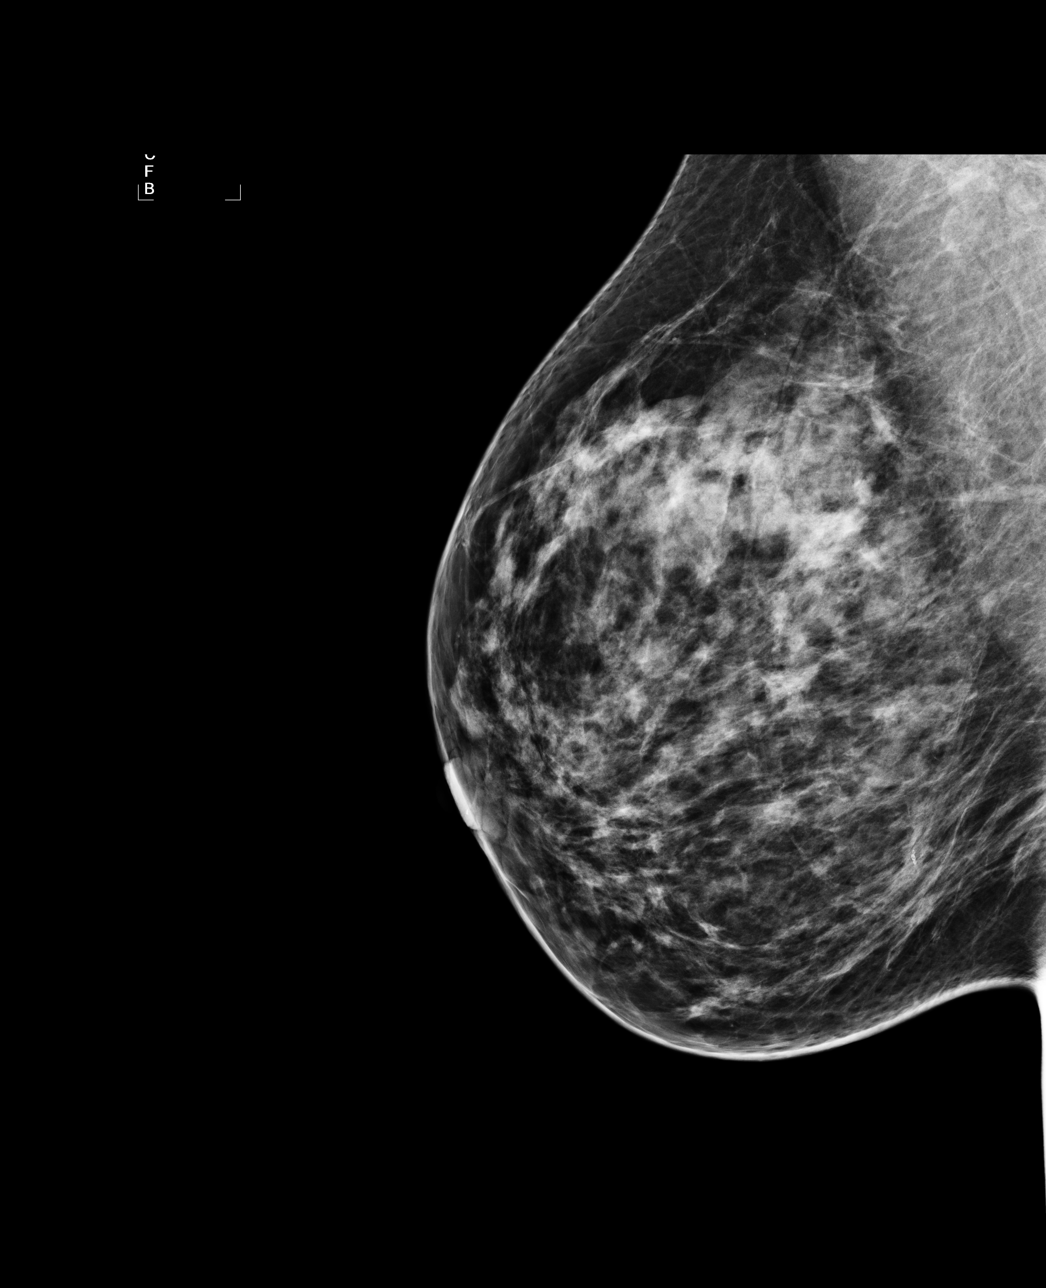

[4 of 4 positions shown; findings below may reference images not displayed]

Canned report from images found in remote index.

Refer to host system for actual result text.

## 2013-06-07 ENCOUNTER — Ambulatory Visit (INDEPENDENT_AMBULATORY_CARE_PROVIDER_SITE_OTHER): Payer: BC Managed Care – PPO | Admitting: Family Medicine

## 2013-06-07 ENCOUNTER — Encounter: Payer: Self-pay | Admitting: Family Medicine

## 2013-06-07 VITALS — BP 129/79 | HR 75 | Resp 16 | Wt 154.0 lb

## 2013-06-07 DIAGNOSIS — IMO0001 Reserved for inherently not codable concepts without codable children: Secondary | ICD-10-CM

## 2013-06-07 DIAGNOSIS — F411 Generalized anxiety disorder: Secondary | ICD-10-CM

## 2013-06-07 DIAGNOSIS — F988 Other specified behavioral and emotional disorders with onset usually occurring in childhood and adolescence: Secondary | ICD-10-CM

## 2013-06-07 DIAGNOSIS — L293 Anogenital pruritus, unspecified: Secondary | ICD-10-CM

## 2013-06-07 DIAGNOSIS — N898 Other specified noninflammatory disorders of vagina: Secondary | ICD-10-CM | POA: Insufficient documentation

## 2013-06-07 MED ORDER — FLUOXETINE HCL 20 MG PO TABS: 20.0000 mg | ORAL_TABLET | Freq: Every day | ORAL | Status: DC

## 2013-06-07 MED ORDER — LISDEXAMFETAMINE DIMESYLATE 30 MG PO CAPS: 30.0000 mg | ORAL_CAPSULE | Freq: Every day | ORAL | Status: DC

## 2013-06-07 MED ORDER — FLUCONAZOLE 200 MG PO TABS: ORAL_TABLET | ORAL | Status: AC

## 2013-06-07 MED ORDER — CLOBETASOL PROPIONATE 0.05 % EX CREA: 1.0000 "application " | TOPICAL_CREAM | Freq: Two times a day (BID) | CUTANEOUS | Status: DC

## 2013-06-07 NOTE — Patient Instructions (Signed)
1)  Vaginal Itching - Take one Diflucan and repeat in a week if needed. You may also apply the clobetasol cream if needed.    2)  Mood/Attention - Stay on your current medications.     Pruritus  Pruritis is an itch. There are many different problems that can cause an itch. Dry skin is one of the most common causes of itching. Most cases of itching do not require medical attention.  HOME CARE INSTRUCTIONS  Make sure your skin is moistened on a regular basis. A moisturizer that contains petroleum jelly is best for keeping moisture in your skin. If you develop a rash, you may try the following for relief:   Use corticosteroid cream.  Apply cool compresses to the affected areas.  Bathe with Epsom salts or baking soda in the bathwater.  Soak in colloidal oatmeal baths. These are available at your pharmacy.  Apply baking soda paste to the rash. Stir water into baking soda until it reaches a paste-like consistency.  Use an anti-itch lotion.  Take over-the-counter diphenhydramine medicine by mouth as the instructions direct.  Avoid scratching. Scratching may cause the rash to become infected. If itching is very bad, your caregiver may suggest prescription lotions or creams to lessen your symptoms.  Avoid hot showers, which can make itching worse. A cold shower may help with itching as long as you use a moisturizer after the shower. SEEK MEDICAL CARE IF: The itching does not go away after several days. Document Released: 10/16/2010 Document Revised: 04/28/2011 Document Reviewed: 10/16/2010 [facility] Patient Information 2014 [place], [place].

## 2013-06-07 NOTE — Progress Notes (Signed)
Subjective:    Patient ID: Melanie Webb, female    DOB: [birth year], 48 y.o.   MRN: [id number]  HPI  Melanie CampanileSandy is here today to have her vagina evaluated.  She feels that she has had a rash for the past week and has felt irritated and itchy.  She wonders if her menstrual pads are causing this irritation even though they are not new for her.  She applied some Miconazole but immediately had to wash it off because she says that it burned.  She has been struggling with some irregular bleeding since she got the Copper T IUD.  She originally got the Mirena but had it removed because she said that the hormone made her "crazy".  She also needs to have her Vyvanse and her Prozac refilled.     Review of Systems  Genitourinary: Positive for menstrual problem (Irregular Bleeding).       Vaginal itching   Past Medical History  Diagnosis Date  . Asthma     Allergic Triggers (Animals)   . Depression   . Anxiety   . Hypertension   . Hyperlipidemia   . Allergy   . Impaired fasting glucose   . Gestational diabetes     First Baby - Diet Controlled - Baby's weight 6 lbs     Past Surgical History  Procedure Laterality Date  . Bladder suspension    . 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    Tobacco Use/Exposure:  None    Alcohol Use:  Rarely    Drug Use:  None   Diet:  Regular   Exercise:  Walking    Hobbies:  Sewing, Scrapbooking              Family History  Problem Relation Age of Onset  . Diabetes Mother   . Hyperlipidemia Mother   . Hypertension Mother   . Dementia Mother   . Depression Mother   . Cancer Mother [id number]    Breast   . Hypertension Father      Current Outpatient  Prescriptions on File Prior to Visit  Medication Sig Dispense Refill  . losartan-hydrochlorothiazide (HYZAAR) 50-12.5 MG per tablet Take 1 tablet by mouth daily.  30 tablet  11  . Vitamin D, Ergocalciferol, (DRISDOL) 50000 UNITS CAPS Take 1 capsule on M/W/F  36 capsule  3   No current facility-administered medications on file prior to visit.     Allergies  Allergen Reactions  . Darvocet [Propoxyphene N-Acetaminophen] Itching  . Penicillins Cross Reactors Itching     Immunization History  Administered Date(s) Administered  . Hepatitis A 06/18/2010  . Hepatitis B 06/18/2010  . Influenza,inj,Quad PF,36+ Mos 10/20/2012  . Pneumococcal-Unspecified 03/24/2008  . Tdap 11/24/2005       Objective:   Physical Exam  Genitourinary:  She has moderate bleeding.  No other discharge was noted.    Psychiatric: She has a normal mood and affect. Her behavior is normal. Judgment and thought content normal.      Assessment & Plan:    Melanie Webb was seen today for vaginal itching and medication refills.    Diagnoses and associated orders for this  visit:  Attention/concentration deficit, non-ADHD Comments: Refilled her Vyvanse for 3 months.    - lisdexamfetamine (VYVANSE) 30 MG capsule; Take 1 capsule (30 mg total) by mouth daily.  Anxiety state, unspecified Comments: Refilled her Prozac.  - FLUoxetine (PROZAC) 20 MG tablet; Take 1 tablet (20 mg total) by mouth daily.  Vaginal itching Comments: She is to take a Diflucan.  If the itching continues, she will apply the clobetasol cream.   - fluconazole (DIFLUCAN) 200 MG tablet; Take 1 tab every other day for 7 days - clobetasol cream (TEMOVATE) 0.05 %; Apply 1 application topically 2 (two) times daily.  TIME SPENT "FACE TO FACE" WITH PATIENT -  30 MINS

## 2013-07-15 ENCOUNTER — Other Ambulatory Visit: Payer: Self-pay | Admitting: *Deleted

## 2013-07-15 DIAGNOSIS — E559 Vitamin D deficiency, unspecified: Secondary | ICD-10-CM

## 2013-07-15 MED ORDER — VITAMIN D (ERGOCALCIFEROL) 1.25 MG (50000 UNIT) PO CAPS: ORAL_CAPSULE | ORAL | Status: DC

## 2013-07-27 ENCOUNTER — Ambulatory Visit: Payer: BC Managed Care – PPO | Admitting: Family Medicine

## 2013-07-27 ENCOUNTER — Other Ambulatory Visit: Payer: Self-pay

## 2013-07-27 DIAGNOSIS — Z1231 Encounter for screening mammogram for malignant neoplasm of breast: Secondary | ICD-10-CM

## 2013-07-28 ENCOUNTER — Encounter: Payer: Self-pay | Admitting: Family Medicine

## 2013-07-28 ENCOUNTER — Ambulatory Visit: Payer: BC Managed Care – PPO | Admitting: Family Medicine

## 2013-07-28 ENCOUNTER — Ambulatory Visit (INDEPENDENT_AMBULATORY_CARE_PROVIDER_SITE_OTHER): Payer: BC Managed Care – PPO | Admitting: Family Medicine

## 2013-07-28 VITALS — BP 151/82 | HR 81 | Resp 16 | Ht 62.0 in | Wt 146.0 lb

## 2013-07-28 DIAGNOSIS — F411 Generalized anxiety disorder: Secondary | ICD-10-CM

## 2013-07-28 DIAGNOSIS — E785 Hyperlipidemia, unspecified: Secondary | ICD-10-CM

## 2013-07-28 DIAGNOSIS — R5381 Other malaise: Secondary | ICD-10-CM

## 2013-07-28 DIAGNOSIS — Z5181 Encounter for therapeutic drug level monitoring: Secondary | ICD-10-CM

## 2013-07-28 DIAGNOSIS — R5383 Other fatigue: Secondary | ICD-10-CM

## 2013-07-28 DIAGNOSIS — E559 Vitamin D deficiency, unspecified: Secondary | ICD-10-CM

## 2013-07-28 NOTE — Progress Notes (Signed)
Subjective:    Patient ID: Melanie Webb, female    DOB: 12-Oct-1965, 48 y.o.   MRN: [id number]  HPI  Melanie Webb is here today wanting to discuss some personal issues.  Her mother is suffering from increased dementia.     Review of Systems  Constitutional: Positive for activity change and fatigue.  Cardiovascular: Negative for chest pain, palpitations and leg swelling.  Psychiatric/Behavioral: Positive for behavioral problems, sleep disturbance and decreased concentration. The patient is nervous/anxious.   All other systems reviewed and are negative.    Past Medical History  Diagnosis Date  . Asthma     Allergic Triggers (Animals)   . Depression   . Anxiety   . Hypertension   . Hyperlipidemia   . Allergy   . Impaired fasting glucose   . Gestational diabetes     First Baby - Diet Controlled - Baby's weight 6 lbs     Past Surgical History  Procedure Laterality Date  . Bladder suspension    . 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    Tobacco Use/Exposure:  None    Alcohol Use:  Rarely    Drug Use:  None   Diet:  Regular   Exercise:  Walking    Hobbies:  Sewing, Scrapbooking              Family History  Problem Relation Age of Onset  . Diabetes Mother   . Hyperlipidemia Mother   . Hypertension Mother   . Dementia Mother   . Depression Mother   . Cancer Mother 52    Breast   . Hypertension Father      Current Outpatient Prescriptions on File Prior to Visit  Medication Sig Dispense Refill  . FLUoxetine (PROZAC) 20 MG tablet Take 1 tablet (20 mg total) by mouth daily.  90 tablet  0  . lisdexamfetamine (VYVANSE) 30 MG capsule Take 1 capsule (30 mg total) by mouth  daily.  30 capsule  0  . losartan-hydrochlorothiazide (HYZAAR) 50-12.5 MG per tablet Take 1 tablet by mouth daily.  30 tablet  11  . Vitamin D, Ergocalciferol, (DRISDOL) 50000 UNITS CAPS capsule Take 1 capsule on M/W/F  36 capsule  2   No current facility-administered medications on file prior to visit.     Allergies  Allergen Reactions  . Darvocet [Propoxyphene N-Acetaminophen] Itching  . Penicillins Cross Reactors Itching     Immunization History  Administered Date(s) Administered  . Hepatitis A 06/18/2010  . Hepatitis B 06/18/2010  . Influenza,inj,Quad PF,36+ Mos 10/20/2012  . Pneumococcal-Unspecified 03/24/2008  . Tdap 11/24/2005       Objective:   Physical Exam  Nursing note and vitals reviewed. Constitutional: She is oriented to person, place, and time. She appears well-developed and well-nourished. She appears distressed (appear to be mild ).  HENT:  Head: Normocephalic.  Neck: Normal range of motion.  Neurological: She is alert and oriented to person, place, and time. She has normal reflexes.  Skin: Skin is warm and dry.  Psychiatric: Her mood appears anxious. She is agitated. She exhibits a depressed mood.      Assessment & Plan:  Melanie Webb was seen today for follow-up.  She is concerned about her risk of developing dementia and wants to do everything she can to avoid developing the health problems her mom is experiencing.  We are going to check labs and will discuss treatments she might consider.    Diagnoses and associated orders for this visit:  Anxiety state, unspecified  Other and unspecified hyperlipidemia - NMR Lipoprofile with Lipids  Other malaise and fatigue - TSH  Unspecified vitamin D deficiency - Vit D  25 hydroxy (rtn osteoporosis monitoring)  Encounter for therapeutic drug monitoring - CBC w/Diff - COMPLETE METABOLIC PANEL WITH GFR   TIME SPENT "FACE TO FACE" WITH PATIENT -  15 MINS

## 2013-08-01 LAB — COMPLETE METABOLIC PANEL WITH GFR
ALT: 21 U/L (ref 0–35)
AST: 22 U/L (ref 0–37)
Albumin: 4.3 g/dL (ref 3.5–5.2)
Alkaline Phosphatase: 68 U/L (ref 39–117)
BUN: 18 mg/dL (ref 6–23)
CO2: 26 mEq/L (ref 19–32)
Calcium: 9.3 mg/dL (ref 8.4–10.5)
Chloride: 104 mEq/L (ref 96–112)
Creat: 0.72 mg/dL (ref 0.50–1.10)
GFR, Est African American: >89 mL/min
GFR, Est Non African American: >89 mL/min
Glucose, Bld: 97 mg/dL (ref 70–99)
Potassium: 4 mEq/L (ref 3.5–5.3)
Sodium: 139 mEq/L (ref 135–145)
Total Bilirubin: 0.3 mg/dL (ref 0.2–1.2)
Total Protein: 6.8 g/dL (ref 6.0–8.3)

## 2013-08-02 LAB — CBC WITH DIFFERENTIAL/PLATELET
Basophils Absolute: 0 10*3/uL (ref 0.0–0.1)
Basophils Relative: 0 % (ref 0–1)
Eosinophils Absolute: 0.1 10*3/uL (ref 0.0–0.7)
Eosinophils Relative: 1 % (ref 0–5)
HCT: 39.2 % (ref 36.0–46.0)
Hemoglobin: 13.4 g/dL (ref 12.0–15.0)
Lymphocytes Relative: 26 % (ref 12–46)
Lymphs Abs: 1.8 10*3/uL (ref 0.7–4.0)
MCH: 29.5 pg (ref 26.0–34.0)
MCHC: 34.2 g/dL (ref 30.0–36.0)
MCV: 86.2 fL (ref 78.0–100.0)
Monocytes Absolute: 0.5 10*3/uL (ref 0.1–1.0)
Monocytes Relative: 7 % (ref 3–12)
Neutro Abs: 4.5 10*3/uL (ref 1.7–7.7)
Neutrophils Relative %: 66 % (ref 43–77)
Platelets: 269 10*3/uL (ref 150–400)
RBC: 4.55 MIL/uL (ref 3.87–5.11)
RDW: 14.9 % (ref 11.5–15.5)
WBC: 6.8 10*3/uL (ref 4.0–10.5)

## 2013-08-02 LAB — VITAMIN D 25 HYDROXY (VIT D DEFICIENCY, FRACTURES): Vit D, 25-Hydroxy: 88 ng/mL (ref 30–89)

## 2013-08-02 LAB — NMR LIPOPROFILE WITH LIPIDS
Cholesterol, Total: 215 mg/dL — ABNORMAL HIGH (ref <200)
HDL Particle Number: 36 umol/L (ref >=30.5)
HDL Size: 8.8 nm — ABNORMAL LOW (ref >=9.2)
HDL-C: 55 mg/dL (ref >=40)
LDL (calc): 139 mg/dL — ABNORMAL HIGH (ref <100)
LDL Particle Number: 1910 nmol/L — ABNORMAL HIGH (ref <1000)
LDL Size: 20.7 nm (ref >20.5)
LP-IR Score: 64 — ABNORMAL HIGH (ref <=45)
Large HDL-P: 5 umol/L (ref >=4.8)
Large VLDL-P: 2.8 nmol/L — ABNORMAL HIGH (ref <=2.7)
Small LDL Particle Number: 1061 nmol/L — ABNORMAL HIGH (ref <=527)
Triglycerides: 103 mg/dL (ref <150)
VLDL Size: 52.7 nm — ABNORMAL HIGH (ref <=46.6)

## 2013-08-02 LAB — TSH: TSH: 0.809 u[IU]/mL (ref 0.350–4.500)

## 2013-08-03 ENCOUNTER — Ambulatory Visit: Payer: BC Managed Care – PPO

## 2013-08-05 ENCOUNTER — Encounter: Payer: Self-pay | Admitting: Family Medicine

## 2013-08-05 ENCOUNTER — Ambulatory Visit (INDEPENDENT_AMBULATORY_CARE_PROVIDER_SITE_OTHER): Payer: BC Managed Care – PPO | Admitting: Family Medicine

## 2013-08-05 VITALS — BP 131/81 | HR 87 | Resp 16 | Wt 146.0 lb

## 2013-08-05 DIAGNOSIS — Z7721 Contact with and (suspected) exposure to potentially hazardous body fluids: Secondary | ICD-10-CM | POA: Insufficient documentation

## 2013-08-05 DIAGNOSIS — F988 Other specified behavioral and emotional disorders with onset usually occurring in childhood and adolescence: Secondary | ICD-10-CM

## 2013-08-05 DIAGNOSIS — R062 Wheezing: Secondary | ICD-10-CM | POA: Insufficient documentation

## 2013-08-05 DIAGNOSIS — F411 Generalized anxiety disorder: Secondary | ICD-10-CM

## 2013-08-05 DIAGNOSIS — E785 Hyperlipidemia, unspecified: Secondary | ICD-10-CM

## 2013-08-05 DIAGNOSIS — R110 Nausea: Secondary | ICD-10-CM

## 2013-08-05 DIAGNOSIS — IMO0001 Reserved for inherently not codable concepts without codable children: Secondary | ICD-10-CM

## 2013-08-05 MED ORDER — ALBUTEROL SULFATE HFA 108 (90 BASE) MCG/ACT IN AERS: 2.0000 | INHALATION_SPRAY | Freq: Four times a day (QID) | RESPIRATORY_TRACT | Status: AC | PRN

## 2013-08-05 MED ORDER — PROMETHAZINE HCL 25 MG RE SUPP: 25.0000 mg | Freq: Four times a day (QID) | RECTAL | Status: DC | PRN

## 2013-08-05 MED ORDER — LISDEXAMFETAMINE DIMESYLATE 30 MG PO CAPS: 30.0000 mg | ORAL_CAPSULE | Freq: Every day | ORAL | Status: DC

## 2013-08-05 MED ORDER — ROSUVASTATIN CALCIUM 20 MG PO TABS
20.0000 mg | ORAL_TABLET | Freq: Every day | ORAL | Status: DC
Start: 2013-08-05 — End: 2022-01-08

## 2013-08-05 MED ORDER — ONDANSETRON 8 MG PO TBDP: 8.0000 mg | ORAL_TABLET | Freq: Three times a day (TID) | ORAL | Status: DC | PRN

## 2013-08-05 MED ORDER — ALBUTEROL SULFATE HFA 108 (90 BASE) MCG/ACT IN AERS: 2.0000 | INHALATION_SPRAY | Freq: Four times a day (QID) | RESPIRATORY_TRACT | Status: DC | PRN

## 2013-08-05 MED ORDER — FLUOXETINE HCL 20 MG PO TABS: 20.0000 mg | ORAL_TABLET | Freq: Every day | ORAL | Status: DC

## 2013-08-05 NOTE — Patient Instructions (Addendum)
Fat and Cholesterol Control Diet  Fat and cholesterol levels in your blood and organs are influenced by your diet. High levels of fat and cholesterol may lead to diseases of the heart, small and large blood vessels, gallbladder, liver, and pancreas.  CONTROLLING FAT AND CHOLESTEROL WITH DIET  Although exercise and lifestyle factors are important, your diet is key. That is because certain foods are known to raise cholesterol and others to lower it. The goal is to balance foods for their effect on cholesterol and more importantly, to replace saturated and trans fat with other types of fat, such as monounsaturated fat, polyunsaturated fat, and omega-3 fatty acids.  On average, a person should consume no more than 15 to 17 g of saturated fat daily. Saturated and trans fats are considered "bad" fats, and they will raise LDL cholesterol. Saturated fats are primarily found in animal products such as meats, butter, and cream. However, that does not mean you need to give up all your favorite foods. Today, there are good tasting, low-fat, low-cholesterol substitutes for most of the things you like to eat. Choose low-fat or nonfat alternatives. Choose round or loin cuts of red meat. These types of cuts are lowest in fat and cholesterol. Chicken (without the skin), fish, veal, and ground turkey breast are great choices. Eliminate fatty meats, such as hot dogs and salami. Even shellfish have little or no saturated fat. Have a 3 oz (85 g) portion when you eat lean meat, poultry, or fish.  Trans fats are also called "partially hydrogenated oils." They are oils that have been scientifically manipulated so that they are solid at room temperature resulting in a longer shelf life and improved taste and texture of foods in which they are added. Trans fats are found in stick margarine, some tub margarines, cookies, crackers, and baked goods.   When baking and cooking, oils are a great substitute for butter. The monounsaturated oils are  especially beneficial since it is believed they lower LDL and raise HDL. The oils you should avoid entirely are saturated tropical oils, such as coconut and palm.   Remember to eat a lot from food groups that are naturally free of saturated and trans fat, including fish, fruit, vegetables, beans, grains (barley, rice, couscous, bulgur wheat), and pasta (without cream sauces).   IDENTIFYING FOODS THAT LOWER FAT AND CHOLESTEROL   Soluble fiber may lower your cholesterol. This type of fiber is found in fruits such as apples, vegetables such as broccoli, potatoes, and carrots, legumes such as beans, peas, and lentils, and grains such as barley. Foods fortified with plant sterols (phytosterol) may also lower cholesterol. You should eat at least 2 g per day of these foods for a cholesterol lowering effect.   Read package labels to identify low-saturated fats, trans fat free, and low-fat foods at the supermarket. Select cheeses that have only 2 to 3 g saturated fat per ounce. Use a heart-healthy tub margarine that is free of trans fats or partially hydrogenated oil. When buying baked goods (cookies, crackers), avoid partially hydrogenated oils. Breads and muffins should be made from whole grains (whole-wheat or whole oat flour, instead of "flour" or "enriched flour"). Buy non-creamy canned soups with reduced salt and no added fats.   FOOD PREPARATION TECHNIQUES   Never deep-fry. If you must fry, either stir-fry, which uses very little fat, or use non-stick cooking sprays. When possible, broil, bake, or roast meats, and steam vegetables. Instead of putting butter or margarine on vegetables, use lemon   and herbs, applesauce, and cinnamon (for squash and sweet potatoes). Use nonfat yogurt, salsa, and low-fat dressings for salads.   LOW-SATURATED FAT / LOW-FAT FOOD SUBSTITUTES  Meats / Saturated Fat (g)  · Avoid: Steak, marbled (3 oz/85 g) / 11 g  · Choose: Steak, lean (3 oz/85 g) / 4 g  · Avoid: Hamburger (3 oz/85 g) / 7  g  · Choose: Hamburger, lean (3 oz/85 g) / 5 g  · Avoid: Ham (3 oz/85 g) / 6 g  · Choose: Ham, lean cut (3 oz/85 g) / 2.4 g  · Avoid: Chicken, with skin, dark meat (3 oz/85 g) / 4 g  · Choose: Chicken, skin removed, dark meat (3 oz/85 g) / 2 g  · Avoid: Chicken, with skin, light meat (3 oz/85 g) / 2.5 g  · Choose: Chicken, skin removed, light meat (3 oz/85 g) / 1 g  Dairy / Saturated Fat (g)  · Avoid: Whole milk (1 cup) / 5 g  · Choose: Low-fat milk, 2% (1 cup) / 3 g  · Choose: Low-fat milk, 1% (1 cup) / 1.5 g  · Choose: Skim milk (1 cup) / 0.3 g  · Avoid: Hard cheese (1 oz/28 g) / 6 g  · Choose: Skim milk cheese (1 oz/28 g) / 2 to 3 g  · Avoid: Cottage cheese, 4% fat (1 cup) / 6.5 g  · Choose: Low-fat cottage cheese, 1% fat (1 cup) / 1.5 g  · Avoid: Ice cream (1 cup) / 9 g  · Choose: Sherbet (1 cup) / 2.5 g  · Choose: Nonfat frozen yogurt (1 cup) / 0.3 g  · Choose: Frozen fruit bar / trace  · Avoid: Whipped cream (1 tbs) / 3.5 g  · Choose: Nondairy whipped topping (1 tbs) / 1 g  Condiments / Saturated Fat (g)  · Avoid: Mayonnaise (1 tbs) / 2 g  · Choose: Low-fat mayonnaise (1 tbs) / 1 g  · Avoid: Butter (1 tbs) / 7 g  · Choose: Extra light margarine (1 tbs) / 1 g  · Avoid: Coconut oil (1 tbs) / 11.8 g  · Choose: Olive oil (1 tbs) / 1.8 g  · Choose: Corn oil (1 tbs) / 1.7 g  · Choose: Safflower oil (1 tbs) / 1.2 g  · Choose: Sunflower oil (1 tbs) / 1.4 g  · Choose: Soybean oil (1 tbs) / 2.4 g  · Choose: Canola oil (1 tbs) / 1 g  Document Released: 02/03/2005 Document Revised: 05/31/2012 Document Reviewed: 07/25/2010  Exit[employer]® Patient Information ©2015 [employer]. This information is not intended to replace advice given to you by your health care Hildred Mollica. Make sure you discuss any questions you have with your health care Shirl Weir.

## 2013-08-05 NOTE — Progress Notes (Signed)
Subjective:    Patient ID: Melanie Webb, female    DOB: Jul 30, 1965, 48 y.o.   MRN: [id number]  HPI  Melanie Webb is here today to follow up on her recent lab results. She is needing to get some medication refills.  1)  Mood:  She is doing well on her Prozac and would like to remain on it.  She needs to get a refill.    2)  ADD:  She needs a refill on her Vyvanse. She continues to do well on the current dose.   3)  Hyperlipidemia:  She admits that she has not been following a healthy diet.     Review of Systems  Constitutional: Negative for activity change, appetite change and fatigue.  Cardiovascular: Negative for chest pain, palpitations and leg swelling.  Psychiatric/Behavioral: Negative for behavioral problems, sleep disturbance and decreased concentration. The patient is not nervous/anxious.   All other systems reviewed and are negative.    Past Medical History  Diagnosis Date  . Asthma     Allergic Triggers (Animals)   . Depression   . Anxiety   . Hypertension   . Hyperlipidemia   . Allergy   . Impaired fasting glucose   . Gestational diabetes     First Baby - Diet Controlled - Baby's weight 6 lbs     Past Surgical History  Procedure Laterality Date  . Bladder suspension    . 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    Tobacco Use/Exposure:  None    Alcohol Use:  Rarely    Drug Use:  None   Diet:  Regular   Exercise:  Walking    Hobbies:  Sewing, Scrapbooking              Family History  Problem Relation Age of Onset  . Diabetes Mother   . Hyperlipidemia Mother   . Hypertension Mother   . Dementia Mother   . Depression Mother   . Cancer Mother [id number]   Breast   . Hypertension Father      Current Outpatient Prescriptions on File Prior to Visit  Medication Sig Dispense Refill  . losartan-hydrochlorothiazide (HYZAAR) 50-12.5 MG per tablet Take 1 tablet by mouth daily.  30 tablet  11  . Vitamin D, Ergocalciferol, (DRISDOL) 50000 UNITS CAPS capsule Take 1 capsule on M/W/F  36 capsule  2   No current facility-administered medications on file prior to visit.     Allergies  Allergen Reactions  . Darvocet [Propoxyphene N-Acetaminophen] Itching  . Penicillins Cross Reactors Itching     Immunization History  Administered Date(s) Administered  . Hepatitis A 06/18/2010  . Hepatitis B 06/18/2010  . Influenza,inj,Quad PF,36+ Mos 10/20/2012  . Pneumococcal-Unspecified 03/24/2008  . Tdap 11/24/2005       Objective:   Physical Exam  Nursing note and vitals reviewed. Constitutional: She is oriented to person, place, and time. She appears well-developed and well-nourished.  HENT:  Head: Normocephalic and atraumatic.  Right Ear: External ear normal.  Left Ear: External ear normal.  Eyes: Conjunctivae and EOM are normal. Pupils are equal, round, and reactive to light.  Neck: Normal range of motion.  Cardiovascular: Normal rate, regular rhythm, normal heart sounds and intact distal pulses.   Pulmonary/Chest: Effort normal and breath sounds normal.  Abdominal: Soft.  Musculoskeletal: Normal range of motion.  Neurological: She is alert and oriented to person, place, and time. She has normal reflexes.  Skin: Skin is warm and dry.  Psychiatric: She has a normal mood and affect. Her behavior is normal. Judgment and thought content normal.       Assessment & Plan:    Melanie Webb was seen today for follow-up.  Diagnoses and associated orders for this visit:  Anxiety state, unspecified - FLUoxetine (PROZAC) 20 MG tablet; Take 1 tablet (20 mg total) by mouth daily.  Wheezing - albuterol (PROVENTIL HFA;VENTOLIN HFA) 108 (90 BASE) MCG/ACT  inhaler; Inhale 2 puffs into the lungs every 6 (six) hours as needed for wheezing.  Nausea alone - promethazine (PHENERGAN) 25 MG suppository; Place 1 suppository (25 mg total) rectally every 6 (six) hours as needed for nausea. - ondansetron (ZOFRAN-ODT) 8 MG disintegrating tablet; Take 1 tablet (8 mg total) by mouth every 8 (eight) hours as needed for nausea.  Attention/concentration deficit, non-ADHD Comments: Refilled her Vyvanse for 3 months.     - lisdexamfetamine (VYVANSE) 30 MG capsule; Take 1 capsule (30 mg total) by mouth daily.  Other and unspecified hyperlipidemia Comments: We discussed her NMR in detail.  Given her results, she is going to get started on Crestor. She will start on 5 mg and then increase to 10 mg.  We'll recheck a NMR in 3-4 months.    - rosuvastatin (CRESTOR) 20 MG tablet; Take 1 tablet (20 mg total) by mouth daily.  Hx of exposure to hazardous bodily fluids Comments: She is to return for STD labs.     TIME SPENT "FACE TO FACE" WITH PATIENT -  30 MINS

## 2013-09-12 ENCOUNTER — Ambulatory Visit
Admission: RE | Admit: 2013-09-12 | Discharge: 2013-09-12 | Disposition: A | Discharge status: Home / Self Care | Payer: BC Managed Care – PPO | Source: Ambulatory Visit

## 2013-09-12 ENCOUNTER — Ambulatory Visit: Payer: BC Managed Care – PPO

## 2013-09-12 DIAGNOSIS — Z1231 Encounter for screening mammogram for malignant neoplasm of breast: Secondary | ICD-10-CM

## 2013-12-02 ENCOUNTER — Other Ambulatory Visit: Payer: Self-pay

## 2014-08-28 ENCOUNTER — Other Ambulatory Visit: Payer: Self-pay

## 2014-08-28 DIAGNOSIS — Z1231 Encounter for screening mammogram for malignant neoplasm of breast: Secondary | ICD-10-CM

## 2014-10-02 ENCOUNTER — Ambulatory Visit: Payer: BC Managed Care – PPO

## 2014-11-07 ENCOUNTER — Inpatient Hospital Stay: Admission: RE | Admit: 2014-11-07 | Payer: BC Managed Care – PPO | Source: Ambulatory Visit

## 2014-12-15 ENCOUNTER — Ambulatory Visit
Admission: RE | Admit: 2014-12-15 | Discharge: 2014-12-15 | Disposition: A | Discharge status: Home / Self Care | Payer: BC Managed Care – PPO | Source: Ambulatory Visit

## 2014-12-15 DIAGNOSIS — Z1231 Encounter for screening mammogram for malignant neoplasm of breast: Secondary | ICD-10-CM

## 2015-03-04 IMAGING — MG MM DIGITAL DIAGNOSTIC UNILAT*R*
2 series · 2 of 2 positions shown · non-contrast
Comparison: 07/22/2012, 06/02/2011, 05/31/2010, 04/13/2009,
03/06/2008

CLINICAL DATA: Patient felt a pea-sized lump at 12 o'clock in the
right breast using a massage implement, not her fingers. She does
not feel it today.

EXAM:
DIGITAL DIAGNOSTIC  RIGHT MAMMOGRAM WITH CAD
ULTRASOUND RIGHT BREAST

[R MLO]
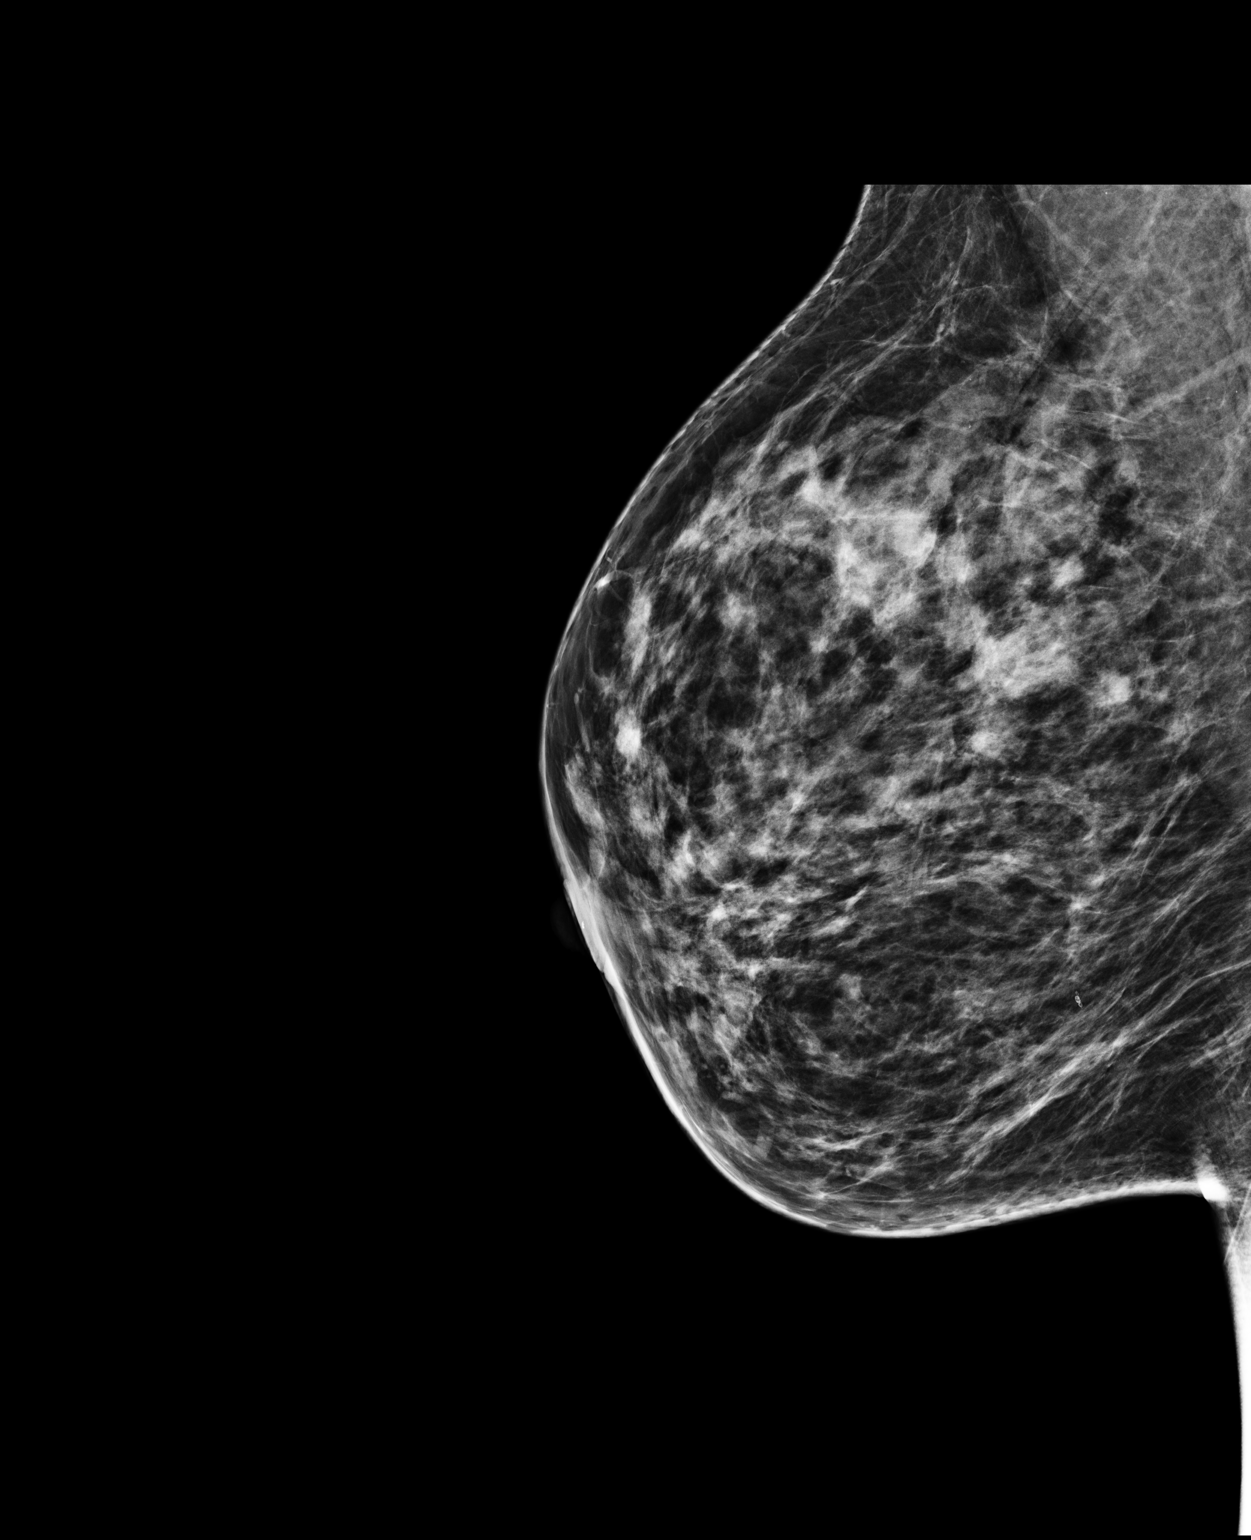

[R CC]
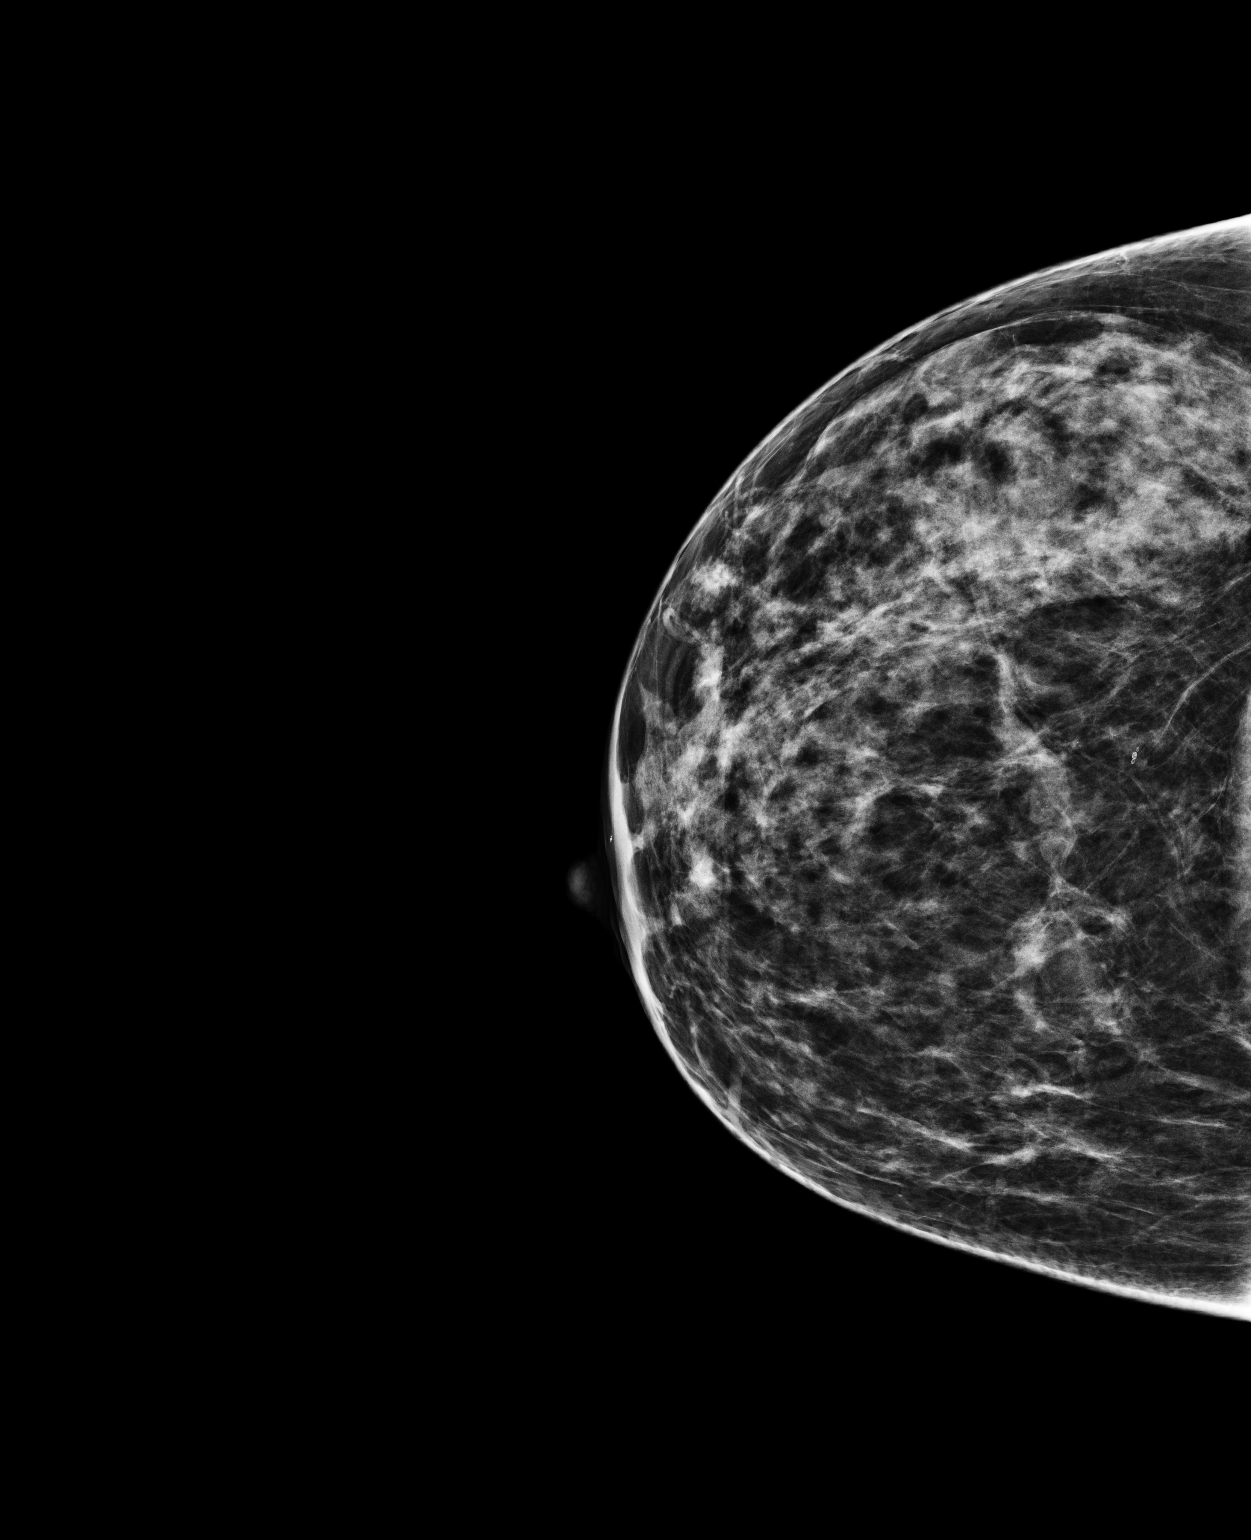

[2 of 2 positions shown; findings below may reference images not displayed]

ACR Breast Density Category c: The breast tissue is heterogeneously
dense, which may obscure small masses.
FINDINGS: There is no suspicious dominant mass, architectural distortion, or
calcification to suggest malignancy.

Mammographic images were processed with CAD.

On physical exam, no mass is palpated in the area of concern to the
patient at 12 o'clock 10 cm from the right nipple.

Ultrasound is performed, showing fatty tissue in the area of concern
to the patient at 12 o'clock 10 cm from the right nipple.
IMPRESSION: No mammographic or sonographic evidence of malignancy.

RECOMMENDATION:
Yearly screening mammography is suggested next scheduled exam in
July 2013.

I have discussed the findings and recommendations with the patient.
Results were also provided in writing at the conclusion of the
visit. If applicable, a reminder letter will be sent to the patient
regarding the next appointment.

BI-RADS CATEGORY  1: Negative.

## 2015-03-04 IMAGING — US US BREAST LTD UNI RIGHT INC AXILLA
1 series · 2 of 2 positions shown · non-contrast
Comparison: 07/22/2012, 06/02/2011, 05/31/2010, 04/13/2009,
03/06/2008

CLINICAL DATA: Patient felt a pea-sized lump at 12 o'clock in the
right breast using a massage implement, not her fingers. She does
not feel it today.

EXAM:
DIGITAL DIAGNOSTIC  RIGHT MAMMOGRAM WITH CAD
ULTRASOUND RIGHT BREAST

[Series 1: us breast ltd uni right inc axilla · 2 of 2 slices shown]
[im 1/2]
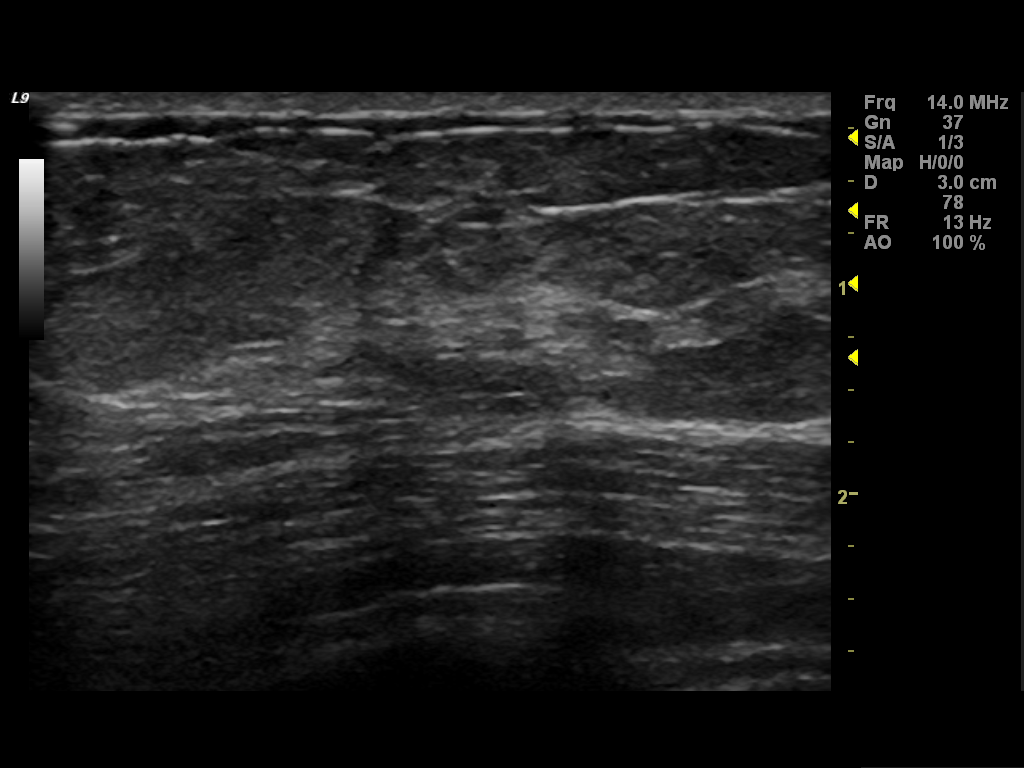
[im 2/2]
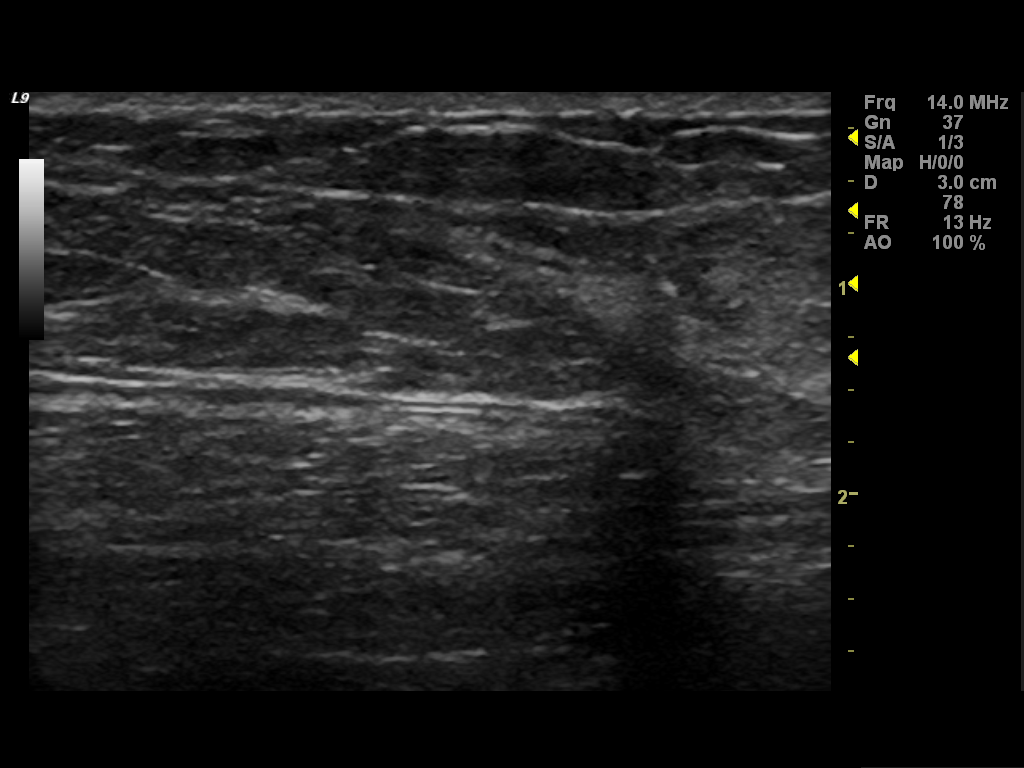

[2 of 2 positions shown; findings below may reference images not displayed]

ACR Breast Density Category c: The breast tissue is heterogeneously
dense, which may obscure small masses.
FINDINGS: There is no suspicious dominant mass, architectural distortion, or
calcification to suggest malignancy.

Mammographic images were processed with CAD.

On physical exam, no mass is palpated in the area of concern to the
patient at 12 o'clock 10 cm from the right nipple.

Ultrasound is performed, showing fatty tissue in the area of concern
to the patient at 12 o'clock 10 cm from the right nipple.
IMPRESSION: No mammographic or sonographic evidence of malignancy.

RECOMMENDATION:
Yearly screening mammography is suggested next scheduled exam in
July 2013.

I have discussed the findings and recommendations with the patient.
Results were also provided in writing at the conclusion of the
visit. If applicable, a reminder letter will be sent to the patient
regarding the next appointment.

BI-RADS CATEGORY  1: Negative.

## 2015-11-21 ENCOUNTER — Other Ambulatory Visit: Payer: Self-pay | Admitting: Family Medicine

## 2015-11-21 DIAGNOSIS — Z1231 Encounter for screening mammogram for malignant neoplasm of breast: Secondary | ICD-10-CM

## 2015-12-01 ENCOUNTER — Encounter: Payer: Self-pay | Admitting: Emergency Medicine

## 2015-12-01 ENCOUNTER — Emergency Department
Admission: EM | Admit: 2015-12-01 | Discharge: 2015-12-01 | Disposition: A | Discharge status: Home / Self Care | Payer: BC Managed Care – PPO | Source: Home / Self Care | Attending: Family Medicine | Admitting: Family Medicine

## 2015-12-01 DIAGNOSIS — B354 Tinea corporis: Secondary | ICD-10-CM

## 2015-12-01 MED ORDER — KETOCONAZOLE 2 % EX CREA: 1.0000 "application " | TOPICAL_CREAM | Freq: Two times a day (BID) | CUTANEOUS | 0 refills | Status: DC

## 2015-12-01 NOTE — Discharge Instructions (Signed)
If itching occurs, may apply 1% hydrocortisone cream once or twice daily. °

## 2015-12-01 NOTE — ED Triage Notes (Signed)
Reports noticing circular rash on left clavicle are 5-6 days ago; no other areas of body; no itching.

## 2015-12-01 NOTE — ED Provider Notes (Signed)
[facility]    CSN: [id number] Arrival date & time: 12/01/15  1006     History   Chief Complaint Chief Complaint  Patient presents with  . Rash    HPI Melanie Webb is a 50 y.o. female.   Patient noticed a non-pruritic rash on her anterior left shoulder about 3 days ago.  She feels well otherwise.   The history is provided by the patient.  Rash  Location: Left neck/shoulder. Quality: dryness and redness   Quality: not blistering, not bruising, not burning, not draining, not itchy, not painful, not peeling, not scaling, not swelling and not weeping   Severity:  Mild Onset quality:  Sudden Duration:  3 days Timing:  Constant Progression:  Unchanged Chronicity:  New Context: not animal contact, not chemical exposure, not exposure to similar rash, not food, not hot tub use, not insect bite/sting, not medications, not new detergent/soap, not nuts, not plant contact and not sick contacts   Relieved by:  None tried Worsened by:  Nothing Ineffective treatments:  None tried Associated symptoms: no fatigue, no fever, no induration, no myalgias and no sore throat     Past Medical History:  Diagnosis Date  . Allergy   . Anxiety   . Asthma    Allergic Triggers (Animals)   . Depression   . Gestational diabetes    First Baby - Diet Controlled - Baby's weight 6 lbs  . Hyperlipidemia   . Hypertension   . Impaired fasting glucose     Patient Active Problem List   Diagnosis Date Noted  . Nausea alone 08/05/2013  . Wheezing 08/05/2013  . Hx of exposure to hazardous bodily fluids 08/05/2013  . Vaginal itching 06/07/2013  . Essential hypertension, benign 04/05/2013  . Attention/concentration deficit, non-ADHD 04/05/2013  . IFG (impaired fasting glucose) 10/20/2012  . Anxiety state, unspecified 10/20/2012  . Concentration deficit 10/20/2012  . Need for prophylactic vaccination and inoculation against influenza 10/20/2012  . Unspecified vitamin D  deficiency 10/20/2012  . Impaired fasting glucose 05/19/2012  . Hypertension 05/17/2012  . Other and unspecified hyperlipidemia 05/17/2012  . Depression 05/17/2012  . Attention and concentration deficit 05/17/2012    Past Surgical History:  Procedure Laterality Date  . BLADDER SUSPENSION    . Bunionectomy Right 1989    OB History    No data available       Home Medications    Prior to Admission medications   Medication Sig Start Date End Date Taking? Authorizing Provider  albuterol (PROVENTIL HFA;VENTOLIN HFA) 108 (90 BASE) MCG/ACT inhaler Inhale 2 puffs into the lungs every 6 (six) hours as needed for wheezing. 08/05/13 08/06/14  Gillian Scarce, MD  FLUoxetine (PROZAC) 20 MG tablet Take 1 tablet (20 mg total) by mouth daily. 08/05/13 08/05/14  Gillian Scarce, MD  ketoconazole (NIZORAL) 2 % cream Apply 1 application topically 2 (two) times daily. 12/01/15   Lattie Haw, MD  lisdexamfetamine (VYVANSE) 30 MG capsule Take 1 capsule (30 mg total) by mouth daily. 08/05/13   Gillian Scarce, MD  losartan-hydrochlorothiazide (HYZAAR) 50-12.5 MG per tablet Take 1 tablet by mouth daily. 01/17/13   Gillian Scarce, MD  ondansetron (ZOFRAN-ODT) 8 MG disintegrating tablet Take 1 tablet (8 mg total) by mouth every 8 (eight) hours as needed for nausea. 08/05/13   Gillian Scarce, MD  promethazine (PHENERGAN) 25 MG suppository Place 1 suppository (25 mg total) rectally every 6 (six) hours as needed for nausea. 08/05/13  Gillian Scarce, MD  rosuvastatin (CRESTOR) 20 MG tablet Take 1 tablet (20 mg total) by mouth daily. 08/05/13 08/06/14  Gillian Scarce, MD  Vitamin D, Ergocalciferol, (DRISDOL) 50000 UNITS CAPS capsule Take 1 capsule on M/W/F 07/15/13   Gillian Scarce, MD    Family History Family History  Problem Relation Age of Onset  . Diabetes Mother   . Hyperlipidemia Mother   . Hypertension Mother   . Dementia Mother   . Depression Mother   . Cancer Mother 61    Breast   . Hypertension Father      Social History Social History  Substance Use Topics  . Smoking status: Never Smoker  . Smokeless tobacco: Never Used  . Alcohol use Yes     Comment: Rarely      Allergies   Darvocet [propoxyphene n-acetaminophen] and Penicillins cross reactors   Review of Systems Review of Systems  Constitutional: Negative for fatigue and fever.  HENT: Negative for sore throat.   Musculoskeletal: Negative for myalgias.  Skin: Positive for rash.  All other systems reviewed and are negative.    Physical Exam Triage Vital Signs ED Triage Vitals  Enc Vitals Group     BP 12/01/15 1032 119/73     Pulse Rate 12/01/15 1032 71     Resp 12/01/15 1032 16     Temp 12/01/15 1032 98.1 F (36.7 C)     Temp Source 12/01/15 1032 Oral     SpO2 12/01/15 1032 99 %     Weight 12/01/15 1032 150 lb (68 kg)     Height 12/01/15 1032 5\' 2"  (1.575 m)     Head Circumference --      Peak Flow --      Pain Score 12/01/15 1035 0     Pain Loc --      Pain Edu? --      Excl. in GC? --    No data found.   Updated Vital Signs BP 119/73 (BP Location: Left Arm)   Pulse 71   Temp 98.1 F (36.7 C) (Oral)   Resp 16   Ht 5\' 2"  (1.575 m)   Wt 150 lb (68 kg)   LMP 10/01/2015 (Within Days) Comment: perimenopausal  SpO2 99%   BMI 27.44 kg/m   Visual Acuity Right Eye Distance:   Left Eye Distance:   Bilateral Distance:    Right Eye Near:   Left Eye Near:    Bilateral Near:     Physical Exam  Constitutional: She appears well-developed and well-nourished. No distress.  HENT:  Head: Normocephalic.  Right Ear: External ear normal.  Left Ear: External ear normal.  Nose: Nose normal.  Mouth/Throat: Oropharynx is clear and moist.  Eyes: Conjunctivae are normal. Pupils are equal, round, and reactive to light.  Neck: Neck supple.    Left anterior neck reveals a faintly erythematous serpentiginous macular eruption about 7cm by 4cm, with central clearing.  No swelling, warmth, induration, or tenderness  to palpation.  Cardiovascular: Normal rate.   Pulmonary/Chest: Effort normal.  Lymphadenopathy:    She has no cervical adenopathy.  Neurological: She is alert.  Skin: Skin is warm and dry.  Nursing note and vitals reviewed.    UC Treatments / Results  Labs (all labs ordered are listed, but only abnormal results are displayed) Labs Reviewed - No data to display  EKG  EKG Interpretation None       Radiology No results found.  Procedures Procedures (including critical  care time)  Medications Ordered in UC Medications - No data to display   Initial Impression / Assessment and Plan / UC Course  I have reviewed the triage vital signs and the nursing notes.  Pertinent labs & imaging results that were available during my care of the patient were reviewed by me and considered in my medical decision making (see chart for details).  Clinical Course  Begin Nizoral cream BID If itching occurs, may apply 1% hydrocortisone cream once or twice daily. Followup with Family Doctor if not improved in about 2 weeks.     Final Clinical Impressions(s) / UC Diagnoses   Final diagnoses:  Tinea corporis    New Prescriptions New Prescriptions   KETOCONAZOLE (NIZORAL) 2 % CREAM    Apply 1 application topically 2 (two) times daily.     Lattie HawStephen A Tyrin Herbers, MD 12/01/15 1053

## 2015-12-28 ENCOUNTER — Ambulatory Visit: Payer: BC Managed Care – PPO

## 2022-01-08 ENCOUNTER — Encounter: Payer: Self-pay | Admitting: Internal Medicine

## 2022-01-08 ENCOUNTER — Ambulatory Visit (INDEPENDENT_AMBULATORY_CARE_PROVIDER_SITE_OTHER): Payer: BC Managed Care – PPO | Admitting: Internal Medicine

## 2022-01-08 VITALS — BP 108/76 | HR 84 | Temp 97.1°F | Resp 18 | Ht 62.0 in | Wt 149.6 lb

## 2022-01-08 DIAGNOSIS — R350 Frequency of micturition: Secondary | ICD-10-CM | POA: Diagnosis not present

## 2022-01-08 DIAGNOSIS — R7303 Prediabetes: Secondary | ICD-10-CM

## 2022-01-08 DIAGNOSIS — R3129 Other microscopic hematuria: Secondary | ICD-10-CM | POA: Diagnosis not present

## 2022-01-08 DIAGNOSIS — R300 Dysuria: Secondary | ICD-10-CM | POA: Diagnosis not present

## 2022-01-08 LAB — POCT URINALYSIS DIPSTICK
Glucose, UA: POSITIVE — AB
Ketones, UA: NEGATIVE
Leukocytes, UA: NEGATIVE
Nitrite, UA: NEGATIVE
Protein, UA: NEGATIVE
Spec Grav, UA: 1.02 (ref 1.010–1.025)
Urobilinogen, UA: 0.2 E.U./dL
pH, UA: 6 (ref 5.0–8.0)

## 2022-01-08 MED ORDER — METFORMIN HCL ER 500 MG PO TB24: 500.0000 mg | ORAL_TABLET | Freq: Every day | ORAL | 3 refills | Status: DC

## 2022-01-08 NOTE — Patient Instructions (Signed)
She will stop jordiance due to dehydration.  Will add metformin for pre-diabetes mellitus.

## 2022-01-08 NOTE — Progress Notes (Signed)
   Acute Office Visit  Subjective:     Patient ID: Melanie Webb, female    DOB: 09-26-1965, 56 y.o.   MRN: [id number]  Chief Complaint  Patient presents with   Urinary Tract Infection    Pain when urinating Starting two days ago    56 years old female is here complaining of burning urination.  She said that after or during urination she also has bladder spasm.  She has urinary tract infection long time ago when she has the same symptoms.  No suprapubic pain, no flank pain.  He is prediabetic and she was started on Jardiance recently.  She also says that she would time she get up and almost passed out.  She admits that she is drinking a lot of water.  Urinary Tract Infection  This is a new problem. Associated symptoms include frequency and hematuria.     Review of Systems  Constitutional: Negative.   Genitourinary:  Positive for dysuria, frequency and hematuria.        Objective:    BP 108/76 (BP Location: Left Arm, Patient Position: Sitting, Cuff Size: Normal)   Pulse 84   Temp (!) 97.1 F (36.2 C) (Temporal)   Resp 18   Ht 5\' 2"  (1.575 m)   Wt 149 lb 9.6 oz (67.9 kg)   SpO2 97%   BMI 27.36 kg/m    Physical Exam Constitutional:      Appearance: Normal appearance.  HENT:     Head: Normocephalic and atraumatic.  Abdominal:     General: Bowel sounds are normal.     Palpations: Abdomen is soft.     Tenderness: There is no abdominal tenderness.  Neurological:     Mental Status: She is alert.     No results found for any visits on 01/08/22.      Assessment & Plan:   Problem List Items Addressed This Visit       Genitourinary   Other microscopic hematuria     Other   Dysuria - Primary   Relevant Orders   POCT Urinalysis Dipstick 01/10/22) (Completed)   Urinary frequency   Relevant Orders   POCT Urinalysis Dipstick (21308) (Completed)   Pre-diabetes  Her urine analysis has no leukocyte, no nitrite.  500 urine and small amount of blood.  I  will repeat urine analysis on next visit.  I will stop Jardiance.  I will start her on metformin 500 mg daily.  No orders of the defined types were placed in this encounter.   No follow-ups on file.  ([id number], MD

## 2022-01-29 ENCOUNTER — Encounter: Payer: Self-pay | Admitting: Internal Medicine

## 2022-01-29 ENCOUNTER — Ambulatory Visit: Payer: BC Managed Care – PPO | Admitting: Internal Medicine

## 2022-01-29 VITALS — BP 128/80 | HR 80 | Temp 97.7°F | Resp 18 | Ht 62.0 in | Wt 151.4 lb

## 2022-01-29 DIAGNOSIS — R3129 Other microscopic hematuria: Secondary | ICD-10-CM

## 2022-01-29 DIAGNOSIS — M79645 Pain in left finger(s): Secondary | ICD-10-CM | POA: Diagnosis not present

## 2022-01-29 MED ORDER — MELOXICAM 15 MG PO TABS: 15.0000 mg | ORAL_TABLET | Freq: Every day | ORAL | 0 refills | Status: DC

## 2022-01-29 NOTE — Progress Notes (Addendum)
   Acute Office Visit  Subjective:     Patient ID: Melanie Webb, female    DOB: [birth year], 56 y.o.   MRN: [id number]  No chief complaint on file.   HPI Patient is in today for left little finger 3rd interphalyngeal joint for 2 months and it is not going away. She denies taking any medications for it. No pain on movement of joint only by pressing it on both sides , No injury or trauma.   She also has hematuria and UTI, She has stopped taking jardiance and is taking metformin without any problem.   Review of Systems  Constitutional: Negative.   Musculoskeletal:  Positive for joint pain.        Objective:    BP 128/80 (BP Location: Left Arm, Patient Position: Sitting, Cuff Size: Normal)   Pulse 80   Temp 97.7 F (36.5 C)   Resp 18   Ht 5\' 2"  (1.575 m)   Wt 151 lb 6 oz (68.7 kg)   SpO2 96%   BMI 27.69 kg/m    Physical Exam Neurological:     Mental Status: She is alert.     No results found for any visits on 01/29/22.      Assessment & Plan:   Problem List Items Addressed This Visit   None   No orders of the defined types were placed in this encounter.   No follow-ups on file.  01/31/22, MD

## 2022-01-30 NOTE — Assessment & Plan Note (Signed)
If pain is not better then we will do x-ray.

## 2022-01-30 NOTE — Assessment & Plan Note (Signed)
No more hematuria

## 2022-03-06 ENCOUNTER — Ambulatory Visit: Payer: BC Managed Care – PPO | Admitting: Internal Medicine

## 2022-03-06 DIAGNOSIS — R7303 Prediabetes: Secondary | ICD-10-CM

## 2022-03-06 DIAGNOSIS — I10 Essential (primary) hypertension: Secondary | ICD-10-CM

## 2022-03-07 LAB — CMP14 + ANION GAP
ALT: 25 IU/L (ref 0–32)
AST: 24 IU/L (ref 0–40)
Albumin/Globulin Ratio: 2 (ref 1.2–2.2)
Albumin: 4.7 g/dL (ref 3.8–4.9)
Alkaline Phosphatase: 76 IU/L (ref 44–121)
Anion Gap: 15 mmol/L (ref 10.0–18.0)
BUN/Creatinine Ratio: 34 — ABNORMAL HIGH (ref 9–23)
BUN: 23 mg/dL (ref 6–24)
Bilirubin Total: 0.2 mg/dL (ref 0.0–1.2)
CO2: 19 mmol/L — ABNORMAL LOW (ref 20–29)
Calcium: 9.5 mg/dL (ref 8.7–10.2)
Chloride: 102 mmol/L (ref 96–106)
Creatinine, Ser: 0.68 mg/dL (ref 0.57–1.00)
Globulin, Total: 2.3 g/dL (ref 1.5–4.5)
Glucose: 115 mg/dL — ABNORMAL HIGH (ref 70–99)
Potassium: 4.5 mmol/L (ref 3.5–5.2)
Sodium: 136 mmol/L (ref 134–144)
Total Protein: 7 g/dL (ref 6.0–8.5)
eGFR: 102 mL/min/{1.73_m2} (ref >59)

## 2022-03-07 LAB — MICROALBUMIN / CREATININE URINE RATIO
Creatinine, Urine: 61.6 mg/dL
Microalb/Creat Ratio: <5 mg/g creat (ref 0–29)
Microalbumin, Urine: <3 ug/mL

## 2022-03-07 LAB — LIPID PANEL
Chol/HDL Ratio: 2.9 ratio (ref 0.0–4.4)
Cholesterol, Total: 184 mg/dL (ref 100–199)
HDL: 64 mg/dL (ref >39)
LDL Chol Calc (NIH): 99 mg/dL (ref 0–99)
Triglycerides: 121 mg/dL (ref 0–149)
VLDL Cholesterol Cal: 21 mg/dL (ref 5–40)

## 2022-03-07 LAB — HEMOGLOBIN A1C
Est. average glucose Bld gHb Est-mCnc: 126 mg/dL
Hgb A1c MFr Bld: 6 % — ABNORMAL HIGH (ref 4.8–5.6)

## 2022-03-07 NOTE — Progress Notes (Unsigned)
Lab Draw

## 2022-03-11 NOTE — Progress Notes (Signed)
Unable to reach patient. Will mail letter out for normal labs

## 2022-03-12 NOTE — Telephone Encounter (Signed)
-----  Message from Garwin Brothers, MD sent at 03/10/2022  3:57 PM EST ----- Labs good ----- Message ----- From: Lavone Neri Lab Results In Sent: 03/07/2022   3:36 AM EST To: Garwin Brothers, MD

## 2022-03-12 NOTE — Telephone Encounter (Signed)
This encounter was created in error - please disregard.

## 2022-03-13 ENCOUNTER — Encounter: Payer: Self-pay | Admitting: Internal Medicine

## 2022-03-13 ENCOUNTER — Ambulatory Visit: Payer: BC Managed Care – PPO | Admitting: Internal Medicine

## 2022-03-13 VITALS — BP 118/70 | HR 88 | Temp 97.6°F | Resp 18 | Ht 62.0 in | Wt 149.5 lb

## 2022-03-13 DIAGNOSIS — E785 Hyperlipidemia, unspecified: Secondary | ICD-10-CM

## 2022-03-13 DIAGNOSIS — G4733 Obstructive sleep apnea (adult) (pediatric): Secondary | ICD-10-CM | POA: Diagnosis not present

## 2022-03-13 DIAGNOSIS — F3341 Major depressive disorder, recurrent, in partial remission: Secondary | ICD-10-CM

## 2022-03-13 DIAGNOSIS — R7303 Prediabetes: Secondary | ICD-10-CM | POA: Diagnosis not present

## 2022-03-13 DIAGNOSIS — R41840 Attention and concentration deficit: Secondary | ICD-10-CM

## 2022-03-13 MED ORDER — CYCLOBENZAPRINE HCL 5 MG PO TABS: 5.0000 mg | ORAL_TABLET | Freq: Every day | ORAL | 6 refills | Status: DC

## 2022-03-13 MED ORDER — MELATONIN 5 MG PO TABS: 5.0000 mg | ORAL_TABLET | Freq: Every day | ORAL | 6 refills | Status: AC

## 2022-03-13 MED ORDER — METFORMIN HCL ER 500 MG PO TB24: 1000.0000 mg | ORAL_TABLET | Freq: Every day | ORAL | 6 refills | Status: DC

## 2022-03-13 NOTE — Assessment & Plan Note (Signed)
Continue with current treatment.  I have reviewed her lipid panel.

## 2022-03-13 NOTE — Assessment & Plan Note (Signed)
Will start CPAP, will also give melatonin to see if that help.

## 2022-03-13 NOTE — Progress Notes (Addendum)
Office Visit  Subjective   Patient ID: Melanie Webb   DOB: 1966-01-08   Age: 57 y.o.   MRN: [id number]   Chief Complaint Chief Complaint  Patient presents with   Follow-up    Primary hypertension, impaired fasting glucose     History of Present Illness 57 years old female with history of hypertension, borderline diabetes, attention deficit hyperactive disorder and hyperlipidemia is here for follow-up.  She has a labs drawn and her hemoglobin A1c was 6.0.  She takes metformin 500 mg daily and Jardiance was stopped because of vaginitis.  She is worried that her sugar is still high.  She has a family history of diabetes. She has attention deficit hyperactive disorder and was getting treatment from psychiatrist and she wanted to continue with this.  She will need refill of Strattera at the end of this month. She also has hyperlipidemia and I have reviewed her lipid panel with her. Patient also says that she has depression anxiety and has been using Prozac, she has decreased the dose to 40 mg and so far has been doing good.  She denies having any suicidal ideation. She also says that she cannot sleep at nighttime.  Years ago she has tried melatonin but that did not help.  She wake up at nighttime and then takes long time to go back to sleep.  She also has sleep study done that shows she has a borderline sleep apnea.  But I have reviewed her sleep study that was done May 23.  She has 33% persistent occurrence of desaturation.  She was prescribed CPAP but she did not get that.  Past Medical History Past Medical History:  Diagnosis Date   Allergy    Anxiety    Asthma    Allergic Triggers (Animals)    Depression    Gestational diabetes    First Baby - Diet Controlled - Baby's weight 6 lbs   Hyperlipidemia    Hypertension    Impaired fasting glucose      Allergies Allergies  Allergen Reactions   Penicillins Cross Reactors Hives and Itching   Codeine Itching   Darvocet  [Propoxyphene N-Acetaminophen] Itching   Other Itching     Review of Systems Review of Systems  Constitutional: Negative.   HENT: Negative.    Respiratory: Negative.    Cardiovascular: Negative.   Psychiatric/Behavioral:  Positive for depression. The patient is nervous/anxious and has insomnia.        Objective:    Vitals BP 118/70 (BP Location: Left Arm, Patient Position: Sitting, Cuff Size: Normal)   Pulse 88   Temp 97.6 F (36.4 C)   Resp 18   Ht 5\' 2"  (1.575 m)   Wt 149 lb 8 oz (67.8 kg)   SpO2 97%   BMI 27.34 kg/m    Physical Examination Physical Exam Constitutional:      Appearance: Normal appearance. She is obese.  HENT:     Head: Normocephalic and atraumatic.  Cardiovascular:     Rate and Rhythm: Normal rate and regular rhythm.     Heart sounds: Normal heart sounds.  Pulmonary:     Effort: Pulmonary effort is normal.     Breath sounds: Normal breath sounds.  Abdominal:     General: Bowel sounds are normal.     Palpations: Abdomen is soft.  Neurological:     General: No focal deficit present.     Mental Status: She is alert and oriented to person, place, and  time.        Assessment & Plan:   Borderline diabetes mellitus I will increase metformin 500 mg 2 tablet PO daily  OSA on CPAP Will start CPAP, will also give melatonin to see if that help.  Attention and concentration deficit Continue with current treatment  Dyslipidemia Continue with current treatment.  I have reviewed her lipid panel.    Return in about 3 months (around 06/12/2022).   Garwin Brothers, MD

## 2022-03-13 NOTE — Assessment & Plan Note (Signed)
Continue with current treatment

## 2022-03-13 NOTE — Assessment & Plan Note (Signed)
I will increase metformin 500 mg 2 tablet PO daily

## 2022-03-13 NOTE — Addendum Note (Signed)
Addended byGarwin Brothers on: 03/13/2022 11:23 AM   Modules accepted: Orders

## 2022-03-25 ENCOUNTER — Other Ambulatory Visit: Payer: Self-pay

## 2022-03-26 ENCOUNTER — Other Ambulatory Visit: Payer: Self-pay

## 2022-03-26 MED ORDER — ATORVASTATIN CALCIUM 20 MG PO TABS: 20.0000 mg | ORAL_TABLET | Freq: Every day | ORAL | 1 refills | Status: DC

## 2022-04-01 ENCOUNTER — Ambulatory Visit: Payer: BC Managed Care – PPO | Admitting: Internal Medicine

## 2022-04-01 ENCOUNTER — Encounter: Payer: Self-pay | Admitting: Internal Medicine

## 2022-04-01 VITALS — BP 108/64 | HR 75 | Temp 97.0°F | Resp 18 | Ht 62.0 in | Wt 152.1 lb

## 2022-04-01 DIAGNOSIS — R300 Dysuria: Secondary | ICD-10-CM

## 2022-04-01 DIAGNOSIS — R41840 Attention and concentration deficit: Secondary | ICD-10-CM | POA: Diagnosis not present

## 2022-04-01 DIAGNOSIS — R7303 Prediabetes: Secondary | ICD-10-CM

## 2022-04-01 DIAGNOSIS — R350 Frequency of micturition: Secondary | ICD-10-CM

## 2022-04-01 DIAGNOSIS — G4733 Obstructive sleep apnea (adult) (pediatric): Secondary | ICD-10-CM

## 2022-04-01 LAB — POCT URINALYSIS DIPSTICK
Bilirubin, UA: NEGATIVE
Glucose, UA: NEGATIVE
Ketones, UA: NEGATIVE
Nitrite, UA: NEGATIVE
Protein, UA: NEGATIVE
Spec Grav, UA: >=1.03 — AB (ref 1.010–1.025)
Urobilinogen, UA: 0.2 E.U./dL
pH, UA: 5.5 (ref 5.0–8.0)

## 2022-04-01 MED ORDER — LISDEXAMFETAMINE DIMESYLATE 20 MG PO CAPS: 20.0000 mg | ORAL_CAPSULE | Freq: Every day | ORAL | 0 refills | Status: DC

## 2022-04-01 NOTE — Progress Notes (Signed)
Office Visit  Subjective   Patient ID: Melanie Webb   DOB: [birth year]   Age: 57 y.o.   MRN: [id number]   Chief Complaint Chief Complaint  Patient presents with   Medication Problem    Discuss Medications     History of Present Illness 57 years old female who is here to discuss medication.  She says that she started having increased urinary frequency, urgency and discomfort in urination.  She has same symptoms last month and her Vania Rea was stopped.  She attributed her symptoms to metformin and stopped taking metformin also.  She has borderline diabetes and her hemoglobin A1c last month was 6.0%.  She was started on metformin 500 mg 2 tablets daily.  No GI symptoms. She also has a ADHD inattentive type.  She was taking Vyvanse but that was changed to azstarys but she says that that did not really help much.  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  She denies having any side effect.  He is due for this medicine. She also has obstructive sleep apnea, I have sent referral for CPAP but she says that she has not heard back from anyone and has not been using it.  She is complaining of staying tired all the time.  Her symptoms been present longer than when she started taking atorvastatin 20 mg daily.  Past Medical History Past Medical History:  Diagnosis Date   Allergy    Anxiety    Asthma    Allergic Triggers (Animals)    Depression    Gestational diabetes    First Baby - Diet Controlled - Baby's weight 6 lbs   Hyperlipidemia    Hypertension    Impaired fasting glucose      Allergies Allergies  Allergen Reactions   Penicillins Cross Reactors Hives and Itching   Codeine Itching   Darvocet [Propoxyphene N-Acetaminophen] Itching   Other Itching     Review of Systems Review of Systems  Constitutional:  Positive for malaise/fatigue.  Respiratory: Negative.    Genitourinary:  Positive for dysuria and frequency. Negative  for flank pain.       Objective:    Vitals BP 108/64 (BP Location: Right Arm, Patient Position: Sitting, Cuff Size: Normal)   Pulse 75   Temp (!) 97 F (36.1 C)   Resp 18   Ht 5' 2"$  (1.575 m)   Wt 152 lb 2 oz (69 kg)   SpO2 97%   BMI 27.82 kg/m    Physical Examination Physical Exam Constitutional:      Appearance: Normal appearance.  Pulmonary:     Effort: Pulmonary effort is normal.  Abdominal:     General: Bowel sounds are normal.     Palpations: Abdomen is soft.  Neurological:     Mental Status: She is alert and oriented to person, place, and time.        Assessment & Plan:   Attention and concentration deficit She has tried Astarys but this did not help as much as vyvanse did, so will send refill of vyvanse  Borderline diabetes mellitus Take metformin 500 mg daily and watch her diet.  Dysuria She has small leukocyte esterase and small amount of blood.  She does not have any periods for last few years.  I will send urine for culture.  If culture is negative I will give her estrogen cream to see if this helps.  OSA on CPAP I believe her  tiredness and fatigue is due to obstructive sleep apnea.  I will send prescription for CPAP and then reevaluate after 3 months.  She can also try taking coenzyme Q 10 and 1 B complex tablet daily.    Return in about 1 month (around 04/30/2022).   Garwin Brothers, MD

## 2022-04-01 NOTE — Assessment & Plan Note (Signed)
I believe her tiredness and fatigue is due to obstructive sleep apnea.  I will send prescription for CPAP and then reevaluate after 3 months.  She can also try taking coenzyme Q 10 and 1 B complex tablet daily.

## 2022-04-01 NOTE — Assessment & Plan Note (Signed)
She has small leukocyte esterase and small amount of blood.  She does not have any periods for last few years.  I will send urine for culture.  If culture is negative I will give her estrogen cream to see if this helps.

## 2022-04-01 NOTE — Assessment & Plan Note (Signed)
Take metformin 500 mg daily and watch her diet.

## 2022-04-01 NOTE — Addendum Note (Signed)
Addended byLucile Shutters on: 04/01/2022 01:59 PM   Modules accepted: Orders

## 2022-04-01 NOTE — Assessment & Plan Note (Signed)
She has tried Systems analyst but this did not help as much as vyvanse did, so will send refill of vyvanse

## 2022-04-02 ENCOUNTER — Encounter: Payer: Self-pay | Admitting: Internal Medicine

## 2022-04-04 LAB — URINE CULTURE

## 2022-04-07 ENCOUNTER — Other Ambulatory Visit: Payer: Self-pay | Admitting: Internal Medicine

## 2022-04-07 MED ORDER — NITROFURANTOIN MACROCRYSTAL 100 MG PO CAPS: 100.0000 mg | ORAL_CAPSULE | Freq: Four times a day (QID) | ORAL | 0 refills | Status: DC

## 2022-04-14 ENCOUNTER — Other Ambulatory Visit: Payer: Self-pay | Admitting: Internal Medicine

## 2022-04-14 ENCOUNTER — Other Ambulatory Visit: Payer: Self-pay

## 2022-04-14 MED ORDER — LISDEXAMFETAMINE DIMESYLATE 20 MG PO CAPS: 20.0000 mg | ORAL_CAPSULE | Freq: Every day | ORAL | 0 refills | Status: DC

## 2022-04-14 MED ORDER — FLUOXETINE HCL 40 MG PO CAPS: 40.0000 mg | ORAL_CAPSULE | Freq: Every day | ORAL | 1 refills | Status: DC

## 2022-05-01 ENCOUNTER — Ambulatory Visit: Payer: BC Managed Care – PPO | Admitting: Internal Medicine

## 2022-05-01 ENCOUNTER — Encounter: Payer: Self-pay | Admitting: Internal Medicine

## 2022-05-01 VITALS — BP 126/80 | HR 85 | Temp 97.0°F | Wt 152.5 lb

## 2022-05-01 DIAGNOSIS — I10 Essential (primary) hypertension: Secondary | ICD-10-CM

## 2022-05-01 DIAGNOSIS — R350 Frequency of micturition: Secondary | ICD-10-CM | POA: Diagnosis not present

## 2022-05-01 MED ORDER — LOSARTAN POTASSIUM 100 MG PO TABS: 100.0000 mg | ORAL_TABLET | Freq: Every day | ORAL | 4 refills | Status: DC

## 2022-05-01 NOTE — Assessment & Plan Note (Signed)
Better so will monitor.

## 2022-05-01 NOTE — Assessment & Plan Note (Signed)
Blood pressure is better so will send refill of losartan.

## 2022-05-01 NOTE — Progress Notes (Signed)
   Office Visit  Subjective   Patient ID: Melanie Webb   DOB: Apr 11, 1965   Age: 57 y.o.   MRN: [id number]   Chief Complaint Chief Complaint  Patient presents with   Follow-up    Urinary Frequency     History of Present Illness 57 years old female is here for follow up of UTI, she has finished antibiotic and her frequency is better, she is asking if I need to repeat UA. She does not have any symptoms os UTI.  She also says since her losartan dose was increased to 100 mg, her blood pressure is much better.  She also saying that she take two Vyvanse as one 20 mg a day does not control her symptoms and the day she is busy she take another tablet in the afternoon.  Past Medical History Past Medical History:  Diagnosis Date   Allergy    Anxiety    Asthma    Allergic Triggers (Animals)    Depression    Gestational diabetes    First Baby - Diet Controlled - Baby's weight 6 lbs   Hyperlipidemia    Hypertension    Impaired fasting glucose      Allergies Allergies  Allergen Reactions   Penicillins Cross Reactors Hives and Itching   Codeine Itching   Darvocet [Propoxyphene N-Acetaminophen] Itching   Other Itching     Review of Systems Review of Systems  Constitutional: Negative.   HENT: Negative.    Respiratory: Negative.    Cardiovascular: Negative.   Gastrointestinal: Negative.   Neurological: Negative.        Objective:    Vitals BP 126/80 (BP Location: Left Arm, Patient Position: Sitting, Cuff Size: Normal)   Pulse 85   Temp (!) 97 F (36.1 C)   Wt 152 lb 8 oz (69.2 kg)   BMI 27.89 kg/m    Physical Examination Physical Exam Constitutional:      Appearance: Normal appearance.  HENT:     Head: Normocephalic and atraumatic.  Cardiovascular:     Rate and Rhythm: Normal rate and regular rhythm.     Heart sounds: Normal heart sounds.  Pulmonary:     Effort: Pulmonary effort is normal.     Breath sounds: Normal breath sounds.  Abdominal:      General: Bowel sounds are normal.     Palpations: Abdomen is soft.  Neurological:     General: No focal deficit present.     Mental Status: She is alert and oriented to person, place, and time.        Assessment & Plan:   Essential hypertension, benign Blood pressure is better so will send refill of losartan.  Urinary frequency Better so will monitor.    No follow-ups on file.   Garwin Brothers, MD

## 2022-05-02 ENCOUNTER — Encounter: Payer: Self-pay | Admitting: Internal Medicine

## 2022-05-07 ENCOUNTER — Other Ambulatory Visit: Payer: Self-pay | Admitting: Internal Medicine

## 2022-05-07 ENCOUNTER — Other Ambulatory Visit: Payer: Self-pay

## 2022-05-07 MED ORDER — LISDEXAMFETAMINE DIMESYLATE 20 MG PO CAPS: 20.0000 mg | ORAL_CAPSULE | Freq: Two times a day (BID) | ORAL | 0 refills | Status: DC

## 2022-05-07 MED ORDER — FLUOXETINE HCL 40 MG PO CAPS: 40.0000 mg | ORAL_CAPSULE | Freq: Every day | ORAL | 0 refills | Status: DC

## 2022-05-07 MED ORDER — METFORMIN HCL ER 500 MG PO TB24: 1000.0000 mg | ORAL_TABLET | Freq: Every day | ORAL | 0 refills | Status: DC

## 2022-05-07 MED ORDER — FLUOXETINE HCL 20 MG PO CAPS: 20.0000 mg | ORAL_CAPSULE | Freq: Every day | ORAL | 0 refills | Status: DC

## 2022-05-15 ENCOUNTER — Encounter: Payer: Self-pay | Admitting: Internal Medicine

## 2022-05-15 ENCOUNTER — Ambulatory Visit: Payer: BC Managed Care – PPO | Admitting: Internal Medicine

## 2022-05-15 VITALS — BP 122/74 | HR 84 | Temp 97.5°F | Resp 18 | Ht 62.0 in | Wt 152.1 lb

## 2022-05-15 DIAGNOSIS — R41840 Attention and concentration deficit: Secondary | ICD-10-CM

## 2022-05-15 MED ORDER — METHYLPHENIDATE HCL ER (CD) 30 MG PO CPCR: 30.0000 mg | ORAL_CAPSULE | ORAL | 0 refills | Status: DC

## 2022-05-15 NOTE — Progress Notes (Signed)
   Office Visit  Subjective   Patient ID: Melanie Webb   DOB: 09-11-1965   Age: 57 y.o.   MRN: [id number]   Chief Complaint Chief Complaint  Patient presents with   office visit     Medication refill      History of Present Illness 57 years old is here for ADHD treatment as it was denies by insurance. 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 She can not focus on her job and it is getting frustrated.    Past Medical History Past Medical History:  Diagnosis Date   Allergy    Anxiety    Asthma    Allergic Triggers (Animals)    Depression    Gestational diabetes    First Baby - Diet Controlled - Baby's weight 6 lbs   Hyperlipidemia    Hypertension    Impaired fasting glucose      Allergies Allergies  Allergen Reactions   Penicillins Cross Reactors Hives and Itching   Codeine Itching   Darvocet [Propoxyphene N-Acetaminophen] Itching   Other Itching     Review of Systems Review of Systems  Constitutional: Negative.   HENT: Negative.    Respiratory: Negative.    Cardiovascular: Negative.   Gastrointestinal: Negative.   Neurological: Negative.        Objective:    Vitals BP 122/74 (BP Location: Left Arm, Patient Position: Sitting, Cuff Size: Normal)   Pulse 84   Temp (!) 97.5 F (36.4 C)   Resp 18   Ht 5\' 2"  (1.575 m)   Wt 152 lb 2 oz (69 kg)   SpO2 99%   BMI 27.82 kg/m    Physical Examination Physical Exam Cardiovascular:     Rate and Rhythm: Normal rate.     Heart sounds: Normal heart sounds.  Neurological:     Mental Status: She is alert.        Assessment & Plan:   No problem-specific Assessment & Plan notes found for this encounter.    No follow-ups on file.   Garwin Brothers, MD

## 2022-05-15 NOTE — Assessment & Plan Note (Signed)
Vyvance was not approved by insurance, I have appealed as well. I will send her prescription for methylphenidate.

## 2022-05-23 ENCOUNTER — Other Ambulatory Visit: Payer: Self-pay | Admitting: Internal Medicine

## 2022-05-23 MED ORDER — FLUOXETINE HCL 40 MG PO CAPS: 40.0000 mg | ORAL_CAPSULE | Freq: Every day | ORAL | 0 refills | Status: DC

## 2022-05-23 MED ORDER — FLUOXETINE HCL 20 MG PO CAPS: 20.0000 mg | ORAL_CAPSULE | Freq: Every day | ORAL | 0 refills | Status: DC

## 2022-05-26 ENCOUNTER — Other Ambulatory Visit: Payer: Self-pay | Admitting: Internal Medicine

## 2022-05-26 MED ORDER — FLUOXETINE HCL 40 MG PO CAPS: 40.0000 mg | ORAL_CAPSULE | Freq: Every day | ORAL | 2 refills | Status: DC

## 2022-05-26 NOTE — Progress Notes (Signed)
I have send her prozac 40 mg three monh supply to her pharmacy

## 2022-05-27 ENCOUNTER — Other Ambulatory Visit: Payer: Self-pay | Admitting: Internal Medicine

## 2022-05-28 ENCOUNTER — Other Ambulatory Visit: Payer: Self-pay

## 2022-05-28 MED ORDER — FLUOXETINE HCL 20 MG PO CAPS: 20.0000 mg | ORAL_CAPSULE | Freq: Every day | ORAL | 2 refills | Status: DC

## 2022-06-02 ENCOUNTER — Other Ambulatory Visit: Payer: Self-pay

## 2022-06-02 MED ORDER — METFORMIN HCL ER 500 MG PO TB24: 1000.0000 mg | ORAL_TABLET | Freq: Every day | ORAL | 2 refills | Status: DC

## 2022-06-05 ENCOUNTER — Ambulatory Visit: Payer: BC Managed Care – PPO

## 2022-06-06 ENCOUNTER — Telehealth: Payer: Self-pay | Admitting: Internal Medicine

## 2022-06-06 NOTE — Telephone Encounter (Signed)
Patient called and is asking if the nurse or the provider has called her insurance company, [employer] [facility] health Plan, to get the information on how to properly word her Vyvanse prescription so she is able to get the medicine filled.  Please call her at her work number [phone] and ask for her.  If it is after school hours she can be reached at [phone].

## 2022-06-06 NOTE — Telephone Encounter (Signed)
Melanie Webb will you please contact her insurance and find out exactly how it needs to be written then we need to explain to Dr Nelson Chimes.

## 2022-06-12 ENCOUNTER — Ambulatory Visit: Payer: BC Managed Care – PPO | Admitting: Internal Medicine

## 2022-06-24 ENCOUNTER — Ambulatory Visit: Payer: BC Managed Care – PPO | Admitting: Internal Medicine

## 2022-06-24 DIAGNOSIS — R7303 Prediabetes: Secondary | ICD-10-CM

## 2022-06-25 LAB — CMP14 + ANION GAP
ALT: 31 IU/L (ref 0–32)
AST: 25 IU/L (ref 0–40)
Albumin/Globulin Ratio: 2.2 (ref 1.2–2.2)
Albumin: 4.6 g/dL (ref 3.8–4.9)
Alkaline Phosphatase: 90 IU/L (ref 44–121)
Anion Gap: 14 mmol/L (ref 10.0–18.0)
BUN/Creatinine Ratio: 22 (ref 9–23)
BUN: 15 mg/dL (ref 6–24)
Bilirubin Total: 0.2 mg/dL (ref 0.0–1.2)
CO2: 22 mmol/L (ref 20–29)
Calcium: 9.4 mg/dL (ref 8.7–10.2)
Chloride: 105 mmol/L (ref 96–106)
Creatinine, Ser: 0.68 mg/dL (ref 0.57–1.00)
Globulin, Total: 2.1 g/dL (ref 1.5–4.5)
Glucose: 106 mg/dL — ABNORMAL HIGH (ref 70–99)
Potassium: 4.5 mmol/L (ref 3.5–5.2)
Sodium: 141 mmol/L (ref 134–144)
Total Protein: 6.7 g/dL (ref 6.0–8.5)
eGFR: 102 mL/min/{1.73_m2} (ref >59)

## 2022-06-25 LAB — MICROALBUMIN / CREATININE URINE RATIO
Creatinine, Urine: 153.1 mg/dL
Microalb/Creat Ratio: 14 mg/g creat (ref 0–29)
Microalbumin, Urine: 21.7 ug/mL

## 2022-06-25 LAB — LIPID PANEL
Chol/HDL Ratio: 3.4 ratio (ref 0.0–4.4)
Cholesterol, Total: 195 mg/dL (ref 100–199)
HDL: 58 mg/dL (ref >39)
LDL Chol Calc (NIH): 112 mg/dL — ABNORMAL HIGH (ref 0–99)
Triglycerides: 142 mg/dL (ref 0–149)
VLDL Cholesterol Cal: 25 mg/dL (ref 5–40)

## 2022-06-25 LAB — HEMOGLOBIN A1C
Est. average glucose Bld gHb Est-mCnc: 126 mg/dL
Hgb A1c MFr Bld: 6 % — ABNORMAL HIGH (ref 4.8–5.6)

## 2022-07-01 ENCOUNTER — Ambulatory Visit: Payer: BC Managed Care – PPO | Admitting: Internal Medicine

## 2022-07-17 ENCOUNTER — Ambulatory Visit: Payer: BC Managed Care – PPO | Admitting: Internal Medicine

## 2022-07-17 ENCOUNTER — Encounter: Payer: Self-pay | Admitting: Internal Medicine

## 2022-07-17 VITALS — BP 122/78 | HR 73 | Temp 97.8°F | Resp 18 | Ht 62.0 in | Wt 147.2 lb

## 2022-07-17 DIAGNOSIS — E785 Hyperlipidemia, unspecified: Secondary | ICD-10-CM | POA: Diagnosis not present

## 2022-07-17 DIAGNOSIS — R7303 Prediabetes: Secondary | ICD-10-CM | POA: Diagnosis not present

## 2022-07-17 DIAGNOSIS — I10 Essential (primary) hypertension: Secondary | ICD-10-CM

## 2022-07-17 DIAGNOSIS — R252 Cramp and spasm: Secondary | ICD-10-CM

## 2022-07-17 NOTE — Assessment & Plan Note (Signed)
She will continue with one metformin daily

## 2022-07-17 NOTE — Assessment & Plan Note (Signed)
controlled 

## 2022-07-17 NOTE — Assessment & Plan Note (Signed)
She has stopped atorvastatin and is watching her diet. Will repeat lipid panel in 6 months.

## 2022-07-17 NOTE — Assessment & Plan Note (Signed)
She will do stretching and and monitor

## 2022-07-17 NOTE — Progress Notes (Signed)
   Office Visit  Subjective   Patient ID: Melanie Webb   DOB: 1965/04/05   Age: 57 y.o.   MRN: [id number]   Chief Complaint Chief Complaint  Patient presents with   Follow-up    Possible side effects from Metformin.     History of Present Illness 57 years old is here for follow up. She has hypertension, borderline diabetes, dyslipidemia, ADHD, depression and is here to discuss there leg cramps and thinking if this could be side effect os metformin.   Her HbA1c is 6.0%. She has borderline diabetes and is watching her diet.  She says that she has stopped atorvastatin 20 mg daily, no side effects, she is watching her diet.   She also has hypertension and she brought her home readings and her BP is well controlled.   She says that she is seeing Psychiatrist who has changed prozac to Trintelex 10 mg daily. She is not using ADHD medicine as this is summer break in school. She also says that she hat ADHD treatment as it was denies by insurance. 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 She can not focus on her job and it is getting frustrated.    Past Medical History Past Medical History:  Diagnosis Date   Allergy    Anxiety    Asthma    Allergic Triggers (Animals)    Depression    Gestational diabetes    First Baby - Diet Controlled - Baby's weight 6 lbs   Hyperlipidemia    Hypertension    Impaired fasting glucose      Allergies Allergies  Allergen Reactions   Penicillins Cross Reactors Hives and Itching   Codeine Itching   Darvocet [Propoxyphene N-Acetaminophen] Itching   Other Itching     Review of Systems Review of Systems  Constitutional: Negative.   HENT: Negative.    Respiratory: Negative.    Cardiovascular: Negative.   Gastrointestinal: Negative.   Neurological: Negative.        Objective:    Vitals BP 122/78  (BP Location: Left Arm, Patient Position: Sitting, Cuff Size: Normal)   Pulse 73   Temp 97.8 F (36.6 C)   Resp 18   Ht 5\' 2"  (1.575 m)   Wt 147 lb 4 oz (66.8 kg)   SpO2 97%   BMI 26.93 kg/m    Physical Examination Physical Exam Constitutional:      Appearance: Normal appearance.  HENT:     Head: Normocephalic.  Cardiovascular:     Rate and Rhythm: Normal rate.     Heart sounds: Normal heart sounds.  Neurological:     General: No focal deficit present.     Mental Status: She is alert and oriented to person, place, and time.        Assessment & Plan:   Hypertension controlled  Dyslipidemia She has stopped atorvastatin and is watching her diet. Will repeat lipid panel in 6 months.  Borderline diabetes mellitus She will continue with one metformin daily  Leg cramps She will do stretching and and monitor    Return in about 6 months (around 01/17/2023).   Eloisa Northern, MD

## 2022-07-29 ENCOUNTER — Ambulatory Visit: Payer: BC Managed Care – PPO | Admitting: Internal Medicine

## 2022-08-26 ENCOUNTER — Other Ambulatory Visit: Payer: Self-pay

## 2022-09-01 ENCOUNTER — Other Ambulatory Visit: Payer: Self-pay

## 2022-09-01 ENCOUNTER — Other Ambulatory Visit: Payer: Self-pay | Admitting: Internal Medicine

## 2022-09-01 MED ORDER — TRINTELLIX 10 MG PO TABS: 10.0000 mg | ORAL_TABLET | Freq: Every day | ORAL | 0 refills | Status: DC

## 2022-09-25 ENCOUNTER — Other Ambulatory Visit: Payer: Self-pay | Admitting: Internal Medicine

## 2022-10-03 ENCOUNTER — Other Ambulatory Visit: Payer: Self-pay | Admitting: Internal Medicine

## 2022-10-03 MED ORDER — LISDEXAMFETAMINE DIMESYLATE 20 MG PO CAPS: 20.0000 mg | ORAL_CAPSULE | Freq: Two times a day (BID) | ORAL | 0 refills | Status: DC

## 2022-11-04 ENCOUNTER — Other Ambulatory Visit: Payer: Self-pay | Admitting: Internal Medicine

## 2022-11-11 ENCOUNTER — Encounter: Payer: Self-pay | Admitting: Internal Medicine

## 2022-11-11 ENCOUNTER — Ambulatory Visit: Payer: BC Managed Care – PPO | Admitting: Internal Medicine

## 2022-11-11 VITALS — BP 122/84 | HR 88 | Temp 98.0°F | Resp 18 | Ht 62.0 in | Wt 142.5 lb

## 2022-11-11 DIAGNOSIS — Z23 Encounter for immunization: Secondary | ICD-10-CM

## 2022-11-14 ENCOUNTER — Other Ambulatory Visit: Payer: Self-pay | Admitting: Internal Medicine

## 2022-11-14 MED ORDER — LISDEXAMFETAMINE DIMESYLATE 20 MG PO CAPS: 20.0000 mg | ORAL_CAPSULE | Freq: Two times a day (BID) | ORAL | 0 refills | Status: DC

## 2022-12-16 ENCOUNTER — Ambulatory Visit: Payer: BC Managed Care – PPO | Admitting: Internal Medicine

## 2022-12-16 DIAGNOSIS — R7303 Prediabetes: Secondary | ICD-10-CM

## 2022-12-16 NOTE — Progress Notes (Signed)
Nurse  visit  Blood draw

## 2022-12-17 LAB — CMP14 + ANION GAP
ALT: 36 [IU]/L — ABNORMAL HIGH (ref 0–32)
AST: 29 [IU]/L (ref 0–40)
Albumin: 4.6 g/dL (ref 3.8–4.9)
Alkaline Phosphatase: 110 [IU]/L (ref 44–121)
Anion Gap: 19 mmol/L — ABNORMAL HIGH (ref 10.0–18.0)
BUN/Creatinine Ratio: 25 — ABNORMAL HIGH (ref 9–23)
BUN: 17 mg/dL (ref 6–24)
Bilirubin Total: 0.2 mg/dL (ref 0.0–1.2)
CO2: 19 mmol/L — ABNORMAL LOW (ref 20–29)
Calcium: 9.5 mg/dL (ref 8.7–10.2)
Chloride: 102 mmol/L (ref 96–106)
Creatinine, Ser: 0.69 mg/dL (ref 0.57–1.00)
Globulin, Total: 2.9 g/dL (ref 1.5–4.5)
Glucose: 109 mg/dL — ABNORMAL HIGH (ref 70–99)
Potassium: 4.7 mmol/L (ref 3.5–5.2)
Sodium: 140 mmol/L (ref 134–144)
Total Protein: 7.5 g/dL (ref 6.0–8.5)
eGFR: 101 mL/min/{1.73_m2} (ref >59)

## 2022-12-17 LAB — LIPID PANEL
Chol/HDL Ratio: 9.7 ratio — ABNORMAL HIGH (ref 0.0–4.4)
Cholesterol, Total: 350 mg/dL — ABNORMAL HIGH (ref 100–199)
HDL: 36 mg/dL — ABNORMAL LOW (ref >39)
Triglycerides: 1015 mg/dL (ref 0–149)

## 2022-12-17 LAB — HEMOGLOBIN A1C
Est. average glucose Bld gHb Est-mCnc: 120 mg/dL
Hgb A1c MFr Bld: 5.8 % — ABNORMAL HIGH (ref 4.8–5.6)

## 2022-12-17 LAB — MICROALBUMIN / CREATININE URINE RATIO
Creatinine, Urine: 124.5 mg/dL
Microalb/Creat Ratio: 10 mg/g{creat} (ref 0–29)
Microalbumin, Urine: 13 ug/mL

## 2022-12-18 ENCOUNTER — Ambulatory Visit: Payer: BC Managed Care – PPO | Admitting: Internal Medicine

## 2022-12-18 DIAGNOSIS — I10 Essential (primary) hypertension: Secondary | ICD-10-CM

## 2022-12-18 NOTE — Progress Notes (Unsigned)
Nurse  visit  Blood draw

## 2022-12-19 LAB — LIPID PANEL
Chol/HDL Ratio: 5.5 ratio — ABNORMAL HIGH (ref 0.0–4.4)
Cholesterol, Total: 294 mg/dL — ABNORMAL HIGH (ref 100–199)
HDL: 53 mg/dL (ref >39)
LDL Chol Calc (NIH): 179 mg/dL — ABNORMAL HIGH (ref 0–99)
Triglycerides: 318 mg/dL — ABNORMAL HIGH (ref 0–149)
VLDL Cholesterol Cal: 62 mg/dL — ABNORMAL HIGH (ref 5–40)

## 2022-12-23 ENCOUNTER — Ambulatory Visit: Payer: BC Managed Care – PPO | Admitting: Internal Medicine

## 2022-12-23 ENCOUNTER — Encounter: Payer: Self-pay | Admitting: Internal Medicine

## 2022-12-23 VITALS — BP 118/60 | HR 99 | Temp 97.4°F | Resp 18 | Ht 62.0 in | Wt 142.0 lb

## 2022-12-23 DIAGNOSIS — E785 Hyperlipidemia, unspecified: Secondary | ICD-10-CM

## 2022-12-23 DIAGNOSIS — F3341 Major depressive disorder, recurrent, in partial remission: Secondary | ICD-10-CM

## 2022-12-23 DIAGNOSIS — F411 Generalized anxiety disorder: Secondary | ICD-10-CM

## 2022-12-23 DIAGNOSIS — Z0000 Encounter for general adult medical examination without abnormal findings: Secondary | ICD-10-CM

## 2022-12-23 DIAGNOSIS — I10 Essential (primary) hypertension: Secondary | ICD-10-CM

## 2022-12-23 DIAGNOSIS — J45909 Unspecified asthma, uncomplicated: Secondary | ICD-10-CM | POA: Insufficient documentation

## 2022-12-23 DIAGNOSIS — Z6825 Body mass index (BMI) 25.0-25.9, adult: Secondary | ICD-10-CM

## 2022-12-23 DIAGNOSIS — J4520 Mild intermittent asthma, uncomplicated: Secondary | ICD-10-CM

## 2022-12-23 DIAGNOSIS — R7303 Prediabetes: Secondary | ICD-10-CM

## 2022-12-23 DIAGNOSIS — R41840 Attention and concentration deficit: Secondary | ICD-10-CM

## 2022-12-23 MED ORDER — LOSARTAN POTASSIUM 100 MG PO TABS: 100.0000 mg | ORAL_TABLET | Freq: Every day | ORAL | 4 refills | Status: DC

## 2022-12-23 MED ORDER — TETANUS-DIPHTHERIA TOXOIDS TD 2-2 LF/0.5ML IM SUSP: 0.5000 mL | Freq: Once | INTRAMUSCULAR | 0 refills | Status: AC

## 2022-12-23 MED ORDER — SHINGRIX 50 MCG/0.5ML IM SUSR: 0.5000 mL | Freq: Once | INTRAMUSCULAR | 0 refills | Status: AC

## 2022-12-23 MED ORDER — LISDEXAMFETAMINE DIMESYLATE 20 MG PO CAPS: 20.0000 mg | ORAL_CAPSULE | Freq: Two times a day (BID) | ORAL | 0 refills | Status: DC

## 2022-12-23 MED ORDER — METFORMIN HCL ER 500 MG PO TB24: 500.0000 mg | ORAL_TABLET | Freq: Every day | ORAL | 3 refills | Status: DC

## 2022-12-23 NOTE — Assessment & Plan Note (Signed)
Her LDL was 179 last week, she will resume statin and will call us the name of medicine.

## 2022-12-23 NOTE — Assessment & Plan Note (Signed)
She need refill of vyvance and she will continue with current dose.

## 2022-12-23 NOTE — Assessment & Plan Note (Signed)
She will continue with metformin 500 mg daily and watch her diet.

## 2022-12-23 NOTE — Assessment & Plan Note (Signed)
Her blood pressure is good

## 2022-12-23 NOTE — Assessment & Plan Note (Signed)
She will continue to follow with psychiatrist.

## 2022-12-23 NOTE — Assessment & Plan Note (Signed)
She says that she has not used her inhalor in a while. Will continue to monitor.

## 2022-12-23 NOTE — Progress Notes (Signed)
Office Visit  Subjective   Patient ID: Melanie Webb   DOB: 1965/08/11   Age: 57 y.o.   MRN: [id number]   Chief Complaint Chief Complaint  Patient presents with   Annual Exam     History of Present Illness 57 years old female is here for PE. 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 She is divorce, she does not smoke, she rarely drink. She does not take any recreational drug.  She has diabetes in her family, her mother has breast cancer, her mother side cousin also has breast cancer.  No heart problem in family.   She drive, no fall.  She take her medications and watch her diet. She has hypertension and her blood pressure is target control. She takes losartan 100 mg daily. She has hyperlipidemia and was taking statin but she has stopped taking it and her LDL is high. She has medications at home and she will restart taking it and will call us the name of the medication.  She also has borderline diabetes and she takes metformin 500 mg daily and her Hb A1c was 5.8% on 12/15/22.   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   She also has anxiety and depression and says she does not enjoy her life. She is taking Trintlex and did not tolerated 15 mg dose. She follows with psychiatrist and she has left the message for them this morning.  She get flu shot every year and has flue and COVID vaccine this year. She think her tetanus booster was more than 10 years ago. No shingle vaccine but has pneumonia vaccine.   She get mammogram every year, she has this year mammogram. She goes to gynecologist for pap smear. She has colonoscopy and they will call when next one is due.    Past Medical History Past Medical History:  Diagnosis Date   Allergy    Anxiety    Asthma    Allergic Triggers (Animals)    Depression    Gestational diabetes    First Baby - Diet Controlled - Baby's  weight 6 lbs   Hyperlipidemia    Hypertension    Impaired fasting glucose      Allergies Allergies  Allergen Reactions   Penicillins Cross Reactors Hives and Itching   Codeine Itching   Darvocet [Propoxyphene N-Acetaminophen] Itching   Other Itching     Review of Systems Review of Systems  Constitutional: Negative.   HENT: Negative.    Respiratory: Negative.    Cardiovascular: Negative.   Gastrointestinal: Negative.   Neurological: Negative.        Objective:    Vitals BP 118/60 (BP Location: Left Arm, Patient Position: Sitting, Cuff Size: Normal)   Pulse 99   Temp (!) 97.4 F (36.3 C)   Resp 18   Ht 5\' 2"  (1.575 m)   Wt 142 lb (64.4 kg)   SpO2 98%   BMI 25.97 kg/m    Physical Examination Physical Exam Eyes:     Extraocular Movements: Extraocular movements intact.  Cardiovascular:     Rate and Rhythm: Normal rate and regular rhythm.  Pulmonary:     Breath sounds: Normal breath sounds.  Abdominal:     General: Bowel sounds are normal.  Neurological:     General: No focal deficit present.     Mental Status: She  is alert and oriented to person, place, and time.        Assessment & Plan:   Essential hypertension, benign Her blood pressure is good.  Attention and concentration deficit She need refill of vyvance and she will continue with current dose.  Depression She will continue to follow with psychiatrist.  Dyslipidemia Her LDL was 179 last week, she will resume statin and will call us the name of medicine.  Asthma She says that she has not used her inhalor in a while. Will continue to monitor.   Borderline diabetes mellitus She will continue with metformin 500 mg daily and watch her diet.    Return in about 3 months (around 03/25/2023).   Eloisa Northern, MD

## 2023-02-03 ENCOUNTER — Other Ambulatory Visit: Payer: Self-pay | Admitting: Internal Medicine

## 2023-02-03 MED ORDER — LISDEXAMFETAMINE DIMESYLATE 20 MG PO CAPS: 20.0000 mg | ORAL_CAPSULE | Freq: Two times a day (BID) | ORAL | 0 refills | Status: DC

## 2023-02-20 ENCOUNTER — Other Ambulatory Visit: Payer: Self-pay

## 2023-03-17 ENCOUNTER — Ambulatory Visit: Payer: Self-pay | Admitting: Internal Medicine

## 2023-03-17 ENCOUNTER — Other Ambulatory Visit: Payer: Self-pay | Admitting: Internal Medicine

## 2023-03-17 DIAGNOSIS — R7303 Prediabetes: Secondary | ICD-10-CM

## 2023-03-17 MED ORDER — LISDEXAMFETAMINE DIMESYLATE 20 MG PO CAPS: 20.0000 mg | ORAL_CAPSULE | Freq: Two times a day (BID) | ORAL | 0 refills | Status: DC

## 2023-03-17 NOTE — Progress Notes (Signed)
NURSE VISIT  BLOOD DRAW

## 2023-03-18 LAB — CMP14 + ANION GAP
ALT: 49 [IU]/L — ABNORMAL HIGH (ref 0–32)
AST: 23 [IU]/L (ref 0–40)
Albumin: 4.5 g/dL (ref 3.8–4.9)
Alkaline Phosphatase: 105 [IU]/L (ref 44–121)
Anion Gap: 17 mmol/L (ref 10.0–18.0)
BUN/Creatinine Ratio: 20 (ref 9–23)
BUN: 15 mg/dL (ref 6–24)
Bilirubin Total: 0.2 mg/dL (ref 0.0–1.2)
CO2: 23 mmol/L (ref 20–29)
Calcium: 9.6 mg/dL (ref 8.7–10.2)
Chloride: 104 mmol/L (ref 96–106)
Creatinine, Ser: 0.75 mg/dL (ref 0.57–1.00)
Globulin, Total: 2.6 g/dL (ref 1.5–4.5)
Glucose: 94 mg/dL (ref 70–99)
Potassium: 4.8 mmol/L (ref 3.5–5.2)
Sodium: 144 mmol/L (ref 134–144)
Total Protein: 7.1 g/dL (ref 6.0–8.5)
eGFR: 92 mL/min/{1.73_m2} (ref >59)

## 2023-03-18 LAB — LIPID PANEL
Chol/HDL Ratio: 4.3 {ratio} (ref 0.0–4.4)
Cholesterol, Total: 221 mg/dL — ABNORMAL HIGH (ref 100–199)
HDL: 51 mg/dL (ref >39)
LDL Chol Calc (NIH): 117 mg/dL — ABNORMAL HIGH (ref 0–99)
Triglycerides: 304 mg/dL — ABNORMAL HIGH (ref 0–149)
VLDL Cholesterol Cal: 53 mg/dL — ABNORMAL HIGH (ref 5–40)

## 2023-03-18 LAB — HEMOGLOBIN A1C
Est. average glucose Bld gHb Est-mCnc: 117 mg/dL
Hgb A1c MFr Bld: 5.7 % — ABNORMAL HIGH (ref 4.8–5.6)

## 2023-03-18 NOTE — Progress Notes (Signed)
Patient called.  Patient aware. Please let her come to I can discuss labs results with her. Patient has an appointment on 03/23/2023 to discuss lab results with Dr. Nelson Chimes

## 2023-03-24 ENCOUNTER — Ambulatory Visit: Payer: 59 | Admitting: Internal Medicine

## 2023-03-24 ENCOUNTER — Encounter: Payer: Self-pay | Admitting: Internal Medicine

## 2023-03-24 VITALS — BP 122/78 | HR 86 | Temp 97.1°F | Resp 18 | Ht 62.0 in | Wt 144.0 lb

## 2023-03-24 DIAGNOSIS — I10 Essential (primary) hypertension: Secondary | ICD-10-CM

## 2023-03-24 DIAGNOSIS — J4520 Mild intermittent asthma, uncomplicated: Secondary | ICD-10-CM | POA: Diagnosis not present

## 2023-03-24 DIAGNOSIS — R41840 Attention and concentration deficit: Secondary | ICD-10-CM

## 2023-03-24 DIAGNOSIS — F3341 Major depressive disorder, recurrent, in partial remission: Secondary | ICD-10-CM | POA: Diagnosis not present

## 2023-03-24 DIAGNOSIS — R945 Abnormal results of liver function studies: Secondary | ICD-10-CM | POA: Insufficient documentation

## 2023-03-24 DIAGNOSIS — E785 Hyperlipidemia, unspecified: Secondary | ICD-10-CM

## 2023-03-24 MED ORDER — ATORVASTATIN CALCIUM 10 MG PO TABS: 10.0000 mg | ORAL_TABLET | Freq: Every day | ORAL | 3 refills | Status: DC

## 2023-03-24 NOTE — Assessment & Plan Note (Signed)
She says that she occasionally use inhaler.

## 2023-03-24 NOTE — Assessment & Plan Note (Signed)
 Controlled.

## 2023-03-24 NOTE — Progress Notes (Signed)
 Office Visit  Subjective   Patient ID: Melanie Webb   DOB: [birth year]   Age: 58 y.o.   MRN: [id number]   Chief Complaint Chief Complaint  Patient presents with   Follow-up    3 month follow up     History of Present Illness 58 years old is here for follow up. She has hypertension, borderline diabetes, dyslipidemia, ADHD, depression and is here to discuss labs and routine follow up. She occasionally get leg cramps. She has borderline diabetes mellitus and take one metformin  500 mg daily. Her HbA1c was 5.7% last week. She is watching weight watcher and stay active.     She has dyslipidemia and has been using atorvastatin  20 mg three time a week, her TRG is little high, I have suggested to take atorvastatin  10 mg daily and monitor. No side effects from the medicine, she is watching her diet.    She also has hypertension and her blood pressure is controlled.  She take losartan  100 mg daily.  No side effect.  I have reviewed her labs with her and her liver function was slightly worse for the last 3 months.  She denies any many symptoms and she does not take any medication that can cause these symptoms either.  She take Vyvanse  for a long time for ADHD.  On the weekend she take 1 tablet and we could days she take 1 tablet twice a day.  She says that she cannot function without this medicine.  She has hyperlipidemia and she was taking atorvastatin  20 mg 3 times a week.  Her triglyceride was very high for last 3 months.  I have suggested to take 1 tablets daily.   She says that she is seeing Psychiatrist who has changed prozac  to Trintelex 10 mg to Lexapro 5 mg daily and that is helping.    Past Medical History Past Medical History:  Diagnosis Date   Allergy    Anxiety    Asthma    Allergic Triggers (Animals)    Depression    Gestational diabetes    First Baby - Diet Controlled - Baby's weight 6 lbs   Hyperlipidemia    Hypertension    Impaired fasting glucose       Allergies Allergies  Allergen Reactions   Penicillins Cross Reactors Hives and Itching   Codeine Itching   Darvocet [Propoxyphene N-Acetaminophen ] Itching   Other Itching     Review of Systems Review of Systems  Constitutional: Negative.   HENT: Negative.    Respiratory: Negative.    Cardiovascular: Negative.   Gastrointestinal: Negative.   Neurological: Negative.        Objective:    Vitals BP 122/78 (BP Location: Left Arm, Patient Position: Sitting, Cuff Size: Normal)   Pulse 86   Temp (!) 97.1 F (36.2 C)   Resp 18   Ht 5' 2 (1.575 m)   Wt 144 lb (65.3 kg)   SpO2 97%   BMI 26.34 kg/m    Physical Examination Physical Exam Constitutional:      Appearance: Normal appearance.  HENT:     Head: Normocephalic and atraumatic.  Cardiovascular:     Rate and Rhythm: Normal rate and regular rhythm.     Heart sounds: Normal heart sounds.  Pulmonary:     Effort: Pulmonary effort is normal.     Breath sounds: Normal breath sounds.  Abdominal:     General: Bowel sounds are normal.     Palpations: Abdomen is  soft.  Neurological:     General: No focal deficit present.     Mental Status: She is alert and oriented to person, place, and time.        Assessment & Plan:   Hypertension Controlled.  Asthma She says that she occasionally use inhaler.  Attention and concentration deficit She will continue with Vyvanse  20 mg twice a day and 1 tablets on the weekend.  Depression Her depression is better and she will follow-up with psychiatrist.  She currently take Lexapro 5 mg daily.  Dyslipidemia She will continue with Lexapro 10 mg daily.  Abnormal liver function Etiology of abnormal ALT.  She will occasionally drink 1 glass of wine.  She does not take any medications either.  Will continue to monitor and repeat liver function on next visit.    Return in about 3 months (around 06/21/2023).   Roetta Dare, MD

## 2023-03-24 NOTE — Assessment & Plan Note (Signed)
Her depression is better and she will follow-up with psychiatrist.  She currently take Lexapro 5 mg daily.

## 2023-03-24 NOTE — Assessment & Plan Note (Signed)
Etiology of abnormal ALT.  She will occasionally drink 1 glass of wine.  She does not take any medications either.  Will continue to monitor and repeat liver function on next visit.

## 2023-03-24 NOTE — Assessment & Plan Note (Signed)
She will continue with Vyvanse 20 mg twice a day and 1 tablets on the weekend.

## 2023-03-24 NOTE — Assessment & Plan Note (Signed)
She will continue with Lexapro 10 mg daily.

## 2023-04-06 ENCOUNTER — Other Ambulatory Visit: Payer: Self-pay | Admitting: Internal Medicine

## 2023-04-06 MED ORDER — ATORVASTATIN CALCIUM 10 MG PO TABS: 10.0000 mg | ORAL_TABLET | Freq: Every day | ORAL | 3 refills | Status: DC

## 2023-04-27 ENCOUNTER — Other Ambulatory Visit: Payer: Self-pay | Admitting: Internal Medicine

## 2023-04-27 MED ORDER — LISDEXAMFETAMINE DIMESYLATE 20 MG PO CAPS: 20.0000 mg | ORAL_CAPSULE | Freq: Two times a day (BID) | ORAL | 0 refills | Status: DC

## 2023-04-30 ENCOUNTER — Other Ambulatory Visit: Payer: Self-pay | Admitting: Internal Medicine

## 2023-05-04 ENCOUNTER — Other Ambulatory Visit: Payer: Self-pay | Admitting: Internal Medicine

## 2023-05-05 ENCOUNTER — Other Ambulatory Visit: Payer: Self-pay | Admitting: Internal Medicine

## 2023-05-05 MED ORDER — LISDEXAMFETAMINE DIMESYLATE 20 MG PO CAPS: 20.0000 mg | ORAL_CAPSULE | Freq: Two times a day (BID) | ORAL | 0 refills | Status: DC

## 2023-06-23 ENCOUNTER — Ambulatory Visit: Payer: 59 | Admitting: Internal Medicine

## 2023-06-23 DIAGNOSIS — R945 Abnormal results of liver function studies: Secondary | ICD-10-CM

## 2023-06-23 NOTE — Progress Notes (Signed)
 Nurse visit  Blood draw

## 2023-06-24 LAB — CMP14 + ANION GAP
ALT: 55 IU/L — ABNORMAL HIGH (ref 0–32)
AST: 34 IU/L (ref 0–40)
Albumin: 4.4 g/dL (ref 3.8–4.9)
Alkaline Phosphatase: 118 IU/L (ref 44–121)
Anion Gap: 13 mmol/L (ref 10.0–18.0)
BUN/Creatinine Ratio: 27 — ABNORMAL HIGH (ref 9–23)
BUN: 17 mg/dL (ref 6–24)
Bilirubin Total: 0.3 mg/dL (ref 0.0–1.2)
CO2: 24 mmol/L (ref 20–29)
Calcium: 9.3 mg/dL (ref 8.7–10.2)
Chloride: 105 mmol/L (ref 96–106)
Creatinine, Ser: 0.64 mg/dL (ref 0.57–1.00)
Globulin, Total: 2.7 g/dL (ref 1.5–4.5)
Glucose: 107 mg/dL — ABNORMAL HIGH (ref 70–99)
Potassium: 4.4 mmol/L (ref 3.5–5.2)
Sodium: 142 mmol/L (ref 134–144)
Total Protein: 7.1 g/dL (ref 6.0–8.5)
eGFR: 102 mL/min/{1.73_m2} (ref >59)

## 2023-06-30 ENCOUNTER — Encounter: Payer: Self-pay | Admitting: Internal Medicine

## 2023-06-30 ENCOUNTER — Ambulatory Visit: Payer: 59 | Admitting: Internal Medicine

## 2023-06-30 VITALS — BP 118/70 | HR 84 | Temp 97.7°F | Resp 18 | Ht 62.0 in | Wt 149.4 lb

## 2023-06-30 DIAGNOSIS — R945 Abnormal results of liver function studies: Secondary | ICD-10-CM | POA: Diagnosis not present

## 2023-06-30 DIAGNOSIS — I10 Essential (primary) hypertension: Secondary | ICD-10-CM | POA: Diagnosis not present

## 2023-06-30 DIAGNOSIS — J4520 Mild intermittent asthma, uncomplicated: Secondary | ICD-10-CM | POA: Diagnosis not present

## 2023-06-30 DIAGNOSIS — R41840 Attention and concentration deficit: Secondary | ICD-10-CM

## 2023-06-30 DIAGNOSIS — F411 Generalized anxiety disorder: Secondary | ICD-10-CM

## 2023-06-30 DIAGNOSIS — E785 Hyperlipidemia, unspecified: Secondary | ICD-10-CM

## 2023-06-30 DIAGNOSIS — K219 Gastro-esophageal reflux disease without esophagitis: Secondary | ICD-10-CM | POA: Diagnosis not present

## 2023-06-30 MED ORDER — FAMOTIDINE 20 MG PO TABS: 20.0000 mg | ORAL_TABLET | Freq: Two times a day (BID) | ORAL | 3 refills | Status: AC

## 2023-06-30 NOTE — Assessment & Plan Note (Signed)
 Her psychiatrist as increase the dose of Lexapro to 10 mg daily.

## 2023-06-30 NOTE — Progress Notes (Signed)
 Office Visit  Subjective   Patient ID: Melanie Webb   DOB: [birth year]   Age: 58 y.o. y.o.   MRN: [id number]   Chief Complaint Chief Complaint  Patient presents with   Hypertension    3 month follow up   Abnormal liver function     History of Present Illness 58 years old is here for follow up. She has hypertension, borderline diabetes, dyslipidemia, ADHD, depression and is here to discuss labs and routine follow up.  Her liver function is still abnormal and AST and ALT started getting worse since I have started her on atorvastatin .  She is also complaining of muscle and joint pain.  She has stopped taking atorvastatin  and her joint pain is better.    She said that her anxiety and depression is getting worse and she has discussed with psychiatrist who suggested to increase the dose of Lexapro to 10 mg daily.  She has borderline diabetes mellitus and take one metformin  500 mg daily. Her HbA1c was 5.7% last week. She is watching weight watcher and stay active.     She also has hypertension and her blood pressure is controlled.  She take losartan  100 mg daily.  No side effect.     She has ADHD and takes Vyvanse  for a long time for ADHD.  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   She says that she is seeing Psychiatrist who has changed prozac  to Trintelex 10 mg to Lexapro 10mg  daily and that is helping.    Past Medical History Past Medical History:  Diagnosis Date   Allergy    Anxiety    Asthma    Allergic Triggers (Animals)    Depression    Gestational diabetes    First Baby - Diet Controlled - Baby's weight 6 lbs   Hyperlipidemia    Hypertension    Impaired fasting glucose      Allergies Allergies  Allergen Reactions   Penicillins Cross Reactors Hives and Itching   Codeine Itching   Darvocet [Propoxyphene N-Acetaminophen ] Itching   Other Itching     Review of Systems Review of Systems   Constitutional: Negative.   HENT: Negative.    Respiratory: Negative.    Cardiovascular: Negative.   Gastrointestinal: Negative.   Neurological: Negative.   Psychiatric/Behavioral:  Positive for depression. The patient is nervous/anxious.        Objective:    Vitals BP 118/70 (BP Location: Left Arm, Patient Position: Sitting, Cuff Size: Normal)   Pulse 84   Temp 97.7 F (36.5 C)   Resp 18   Ht 5\' 2"  (1.575 m)   Wt 149 lb 6 oz (67.8 kg)   SpO2 97%   BMI 27.32 kg/m    Physical Examination Physical Exam Constitutional:      Appearance: Normal appearance.  HENT:     Head: Normocephalic and atraumatic.  Cardiovascular:     Rate and Rhythm: Normal rate and regular rhythm.     Heart sounds: Normal heart sounds.  Pulmonary:     Effort: Pulmonary effort is normal.     Breath sounds: Normal breath sounds.  Abdominal:     General: Bowel sounds are normal.     Palpations: Abdomen is soft.  Neurological:     General: No focal deficit present.     Mental Status: She is alert and oriented to person, place, and time.  Assessment & Plan:   Essential hypertension, benign   Blood pressure controlled.  Asthma   Better  Gastroesophageal reflux disease  I have discussed with her that she will not lie down or rest after eating and will Stroll for few minutes.  I will also start on Pepcid 20 mg  Twice a day for 3 weeks.  Abnormal liver function  Probably due to atorvastatin  and since she has stopped this medicine will repeat lipid panel on next visit.  Anxiety state   Her psychiatrist as increase the dose of Lexapro to 10 mg daily.  Attention and concentration deficit   She will continue with Vyvanse .  Dyslipidemia   She will continue to watch her diet and walk.  I have stopped atorvastatin  because of abnormal liver function and muscle and body pain.    Return in about 3 months (around 09/30/2023).   Tita Form, MD

## 2023-06-30 NOTE — Assessment & Plan Note (Signed)
 Better

## 2023-06-30 NOTE — Assessment & Plan Note (Signed)
 Probably due to atorvastatin  and since she has stopped this medicine will repeat lipid panel on next visit.

## 2023-06-30 NOTE — Assessment & Plan Note (Signed)
 She will continue with Vyvanse .

## 2023-06-30 NOTE — Assessment & Plan Note (Signed)
 Blood pressure controlled.

## 2023-06-30 NOTE — Assessment & Plan Note (Signed)
 She will continue to watch her diet and walk.  I have stopped atorvastatin  because of abnormal liver function and muscle and body pain.

## 2023-06-30 NOTE — Assessment & Plan Note (Signed)
 I have discussed with her that she will not lie down or rest after eating and will Stroll for few minutes.  I will also start on Pepcid 20 mg  Twice a day for 3 weeks.

## 2023-07-17 ENCOUNTER — Other Ambulatory Visit: Payer: Self-pay | Admitting: Internal Medicine

## 2023-08-07 ENCOUNTER — Encounter: Payer: Self-pay | Admitting: Internal Medicine

## 2023-08-07 ENCOUNTER — Ambulatory Visit: Admitting: Internal Medicine

## 2023-08-07 VITALS — BP 124/80 | HR 91 | Temp 98.0°F | Resp 18 | Ht 62.0 in | Wt 150.0 lb

## 2023-08-07 DIAGNOSIS — W57XXXA Bitten or stung by nonvenomous insect and other nonvenomous arthropods, initial encounter: Secondary | ICD-10-CM | POA: Diagnosis not present

## 2023-08-07 DIAGNOSIS — S20369A Insect bite (nonvenomous) of unspecified front wall of thorax, initial encounter: Secondary | ICD-10-CM | POA: Diagnosis not present

## 2023-08-07 MED ORDER — DOXYCYCLINE HYCLATE 100 MG PO CAPS: 100.0000 mg | ORAL_CAPSULE | Freq: Two times a day (BID) | ORAL | 0 refills | Status: AC

## 2023-08-13 NOTE — Assessment & Plan Note (Signed)
 I will start her on doxycycline  100 mg twice a day for 2 weeks.  She will apply calamine lotion for itching.  If rash is not better she will call or come back.

## 2023-08-13 NOTE — Progress Notes (Signed)
   Acute Office Visit  Subjective:     Patient ID: Melanie Webb, female    DOB: [birth year], 58 y.o.   MRN: [id number]  Chief Complaint  Patient presents with   Office visit    Bite,swollen  and red    HPI Patient is in today for  tick bite in her right upper chest and worsening of redness for last 2 weeks.  She says that she was not [place]  where she has a tick bite.  She did not see anybody.  She says that her here he has getting red and rash is getting worse.  She started having mild headache and tiredness.  No fever or chills.  She says that headache is not worse.  Review of Systems  Constitutional: Negative.   Respiratory: Negative.    Cardiovascular: Negative.   Musculoskeletal:  Positive for joint pain.  Skin:  Positive for itching and rash.  Neurological:  Positive for headaches.        Objective:    BP 124/80 (BP Location: Left Arm, Patient Position: Sitting, Cuff Size: Normal)   Pulse 91   Temp 98 F (36.7 C)   Resp 18   Ht 5' 2 (1.575 m)   Wt 150 lb (68 kg)   SpO2 98%   BMI 27.44 kg/m    Physical Exam Constitutional:      Appearance: Normal appearance.  HENT:     Head: Normocephalic and atraumatic.  Pulmonary:     Effort: Pulmonary effort is normal.     Breath sounds: Normal breath sounds.  Abdominal:     Palpations: Abdomen is soft.   Skin:    Findings: Erythema present.     Comments:   She have significant rash that is spreading and female looks like bull's-eye.   Neurological:     Mental Status: She is alert.     No results found for any visits on 08/07/23.      Assessment & Plan:   Problem List Items Addressed This Visit       Musculoskeletal and Integument   Tick bite of front wall of thorax - Primary     I will start her on doxycycline  100 mg twice a day for 2 weeks.  She will apply calamine lotion for itching.  If rash is not better she will call or come back.       Meds ordered this encounter  Medications    doxycycline  (VIBRAMYCIN ) 100 MG capsule    Sig: Take 1 capsule (100 mg total) by mouth 2 (two) times daily for 14 days.    Dispense:  28 capsule    Refill:  0    No follow-ups on file.  Roetta Dare, MD

## 2023-09-15 ENCOUNTER — Ambulatory Visit: Admitting: Internal Medicine

## 2023-09-15 DIAGNOSIS — R945 Abnormal results of liver function studies: Secondary | ICD-10-CM | POA: Diagnosis not present

## 2023-09-15 DIAGNOSIS — R7303 Prediabetes: Secondary | ICD-10-CM | POA: Diagnosis not present

## 2023-09-15 DIAGNOSIS — E785 Hyperlipidemia, unspecified: Secondary | ICD-10-CM

## 2023-09-15 NOTE — Progress Notes (Signed)
   Established Patient Office Visit  Subjective   Patient ID: Melanie Webb, female    DOB: 10/20/1965  Age: 58 y.o. MRN: [id number]  Chief Complaint  Patient presents with   nurse visit    Blood draw     HPI    ROS    Objective:     There were no vitals taken for this visit.   Physical Exam   No results found for any visits on 09/15/23.    The 10-year ASCVD risk score (Arnett DK, et al., 2019) is: 3.9%    Assessment & Plan:   Problem List Items Addressed This Visit     Abnormal liver function   Relevant Orders   Lipid panel   CMP14 + Anion Gap   Hemoglobin A1c   Borderline diabetes mellitus   Relevant Orders   Lipid panel   CMP14 + Anion Gap   Hemoglobin A1c   Dyslipidemia - Primary   Relevant Orders   Lipid panel   CMP14 + Anion Gap   Hemoglobin A1c    No follow-ups on file.    Janthony Holleman

## 2023-09-16 LAB — CMP14 + ANION GAP
ALT: 33 IU/L — ABNORMAL HIGH (ref 0–32)
AST: 34 IU/L (ref 0–40)
Albumin: 4.6 g/dL (ref 3.8–4.9)
Alkaline Phosphatase: 102 IU/L (ref 44–121)
Anion Gap: 17 mmol/L (ref 10.0–18.0)
BUN/Creatinine Ratio: 25 — ABNORMAL HIGH (ref 9–23)
BUN: 18 mg/dL (ref 6–24)
Bilirubin Total: 0.3 mg/dL (ref 0.0–1.2)
CO2: 21 mmol/L (ref 20–29)
Calcium: 9.6 mg/dL (ref 8.7–10.2)
Chloride: 103 mmol/L (ref 96–106)
Creatinine, Ser: 0.72 mg/dL (ref 0.57–1.00)
Globulin, Total: 2.5 g/dL (ref 1.5–4.5)
Glucose: 99 mg/dL (ref 70–99)
Potassium: 4.6 mmol/L (ref 3.5–5.2)
Sodium: 141 mmol/L (ref 134–144)
Total Protein: 7.1 g/dL (ref 6.0–8.5)
eGFR: 97 mL/min/1.73 (ref >59)

## 2023-09-16 LAB — LIPID PANEL
Chol/HDL Ratio: 5.4 ratio — ABNORMAL HIGH (ref 0.0–4.4)
Cholesterol, Total: 276 mg/dL — ABNORMAL HIGH (ref 100–199)
HDL: 51 mg/dL (ref >39)
LDL Chol Calc (NIH): 147 mg/dL — ABNORMAL HIGH (ref 0–99)
Triglycerides: 418 mg/dL — ABNORMAL HIGH (ref 0–149)
VLDL Cholesterol Cal: 78 mg/dL — ABNORMAL HIGH (ref 5–40)

## 2023-09-16 LAB — HEMOGLOBIN A1C
Est. average glucose Bld gHb Est-mCnc: 114 mg/dL
Hgb A1c MFr Bld: 5.6 % (ref 4.8–5.6)

## 2023-09-17 ENCOUNTER — Ambulatory Visit: Payer: Self-pay

## 2023-09-18 ENCOUNTER — Ambulatory Visit: Admitting: Internal Medicine

## 2023-09-18 ENCOUNTER — Encounter: Payer: Self-pay | Admitting: Internal Medicine

## 2023-09-18 VITALS — BP 120/80 | HR 94 | Temp 97.8°F | Resp 18 | Ht 62.0 in | Wt 150.0 lb

## 2023-09-18 DIAGNOSIS — R945 Abnormal results of liver function studies: Secondary | ICD-10-CM | POA: Diagnosis not present

## 2023-09-18 DIAGNOSIS — J4520 Mild intermittent asthma, uncomplicated: Secondary | ICD-10-CM | POA: Diagnosis not present

## 2023-09-18 DIAGNOSIS — I10 Essential (primary) hypertension: Secondary | ICD-10-CM

## 2023-09-18 DIAGNOSIS — E785 Hyperlipidemia, unspecified: Secondary | ICD-10-CM

## 2023-09-18 DIAGNOSIS — R7303 Prediabetes: Secondary | ICD-10-CM

## 2023-09-18 DIAGNOSIS — G4700 Insomnia, unspecified: Secondary | ICD-10-CM

## 2023-09-18 MED ORDER — TRAZODONE HCL 50 MG PO TABS: 50.0000 mg | ORAL_TABLET | Freq: Every day | ORAL | 6 refills | Status: DC

## 2023-09-18 MED ORDER — FENOFIBRATE 145 MG PO TABS: 145.0000 mg | ORAL_TABLET | Freq: Every day | ORAL | 11 refills | Status: AC

## 2023-09-18 NOTE — Assessment & Plan Note (Signed)
 Her blood pressure is well controlled.

## 2023-09-18 NOTE — Assessment & Plan Note (Signed)
 I will do test for fatty liver disease

## 2023-09-18 NOTE — Progress Notes (Addendum)
 Office Visit  Subjective   Patient ID: Melanie Webb   DOB: 1965/12/10   Age: 58 y.o.   MRN: [id number]   Chief Complaint Chief Complaint  Patient presents with   Follow-up    3 month follow up     History of Present Illness 58 years old is here for follow up.  She was seen by me last on for take by and she is better now.  She says that her school is starting and she is back to work now.    I have reviewed her labs that was drawn last week.  Her liver function is still abnormal.  She is not taking any medication that can explain her abnormal liver function.  She tells me that her mother has fatty liver disease.  She does not drink alcohol.  She has hypertension and her blood pressure is well controlled.   She does not check her blood pressure at home.    She also has elevated triglyceride and LDL is also high.  Her HDL is 58. Her 10 year cardiovascular risk is 4.8%.  She used to take fenofibrate  and I have started her on statin but she stopped taking that because of  Muscle and leg pain and Leg cramps.    She has anxiety depression and ADHD.  She follows with psychiatrist.  She says that she has hard time sleeping.  Her psychiatrist told her to talk to us .    She has borderline diabetes mellitus and take one metformin  500 mg daily. Her HbA1c was 5.7% last week. She is watching weight watcher and stay active.     She also has hypertension and her blood pressure is controlled.  She take losartan  100 mg daily.  No side effect.       Past Medical History Past Medical History:  Diagnosis Date   Allergy    Anxiety    Asthma    Allergic Triggers (Animals)    Depression    Gestational diabetes    First Baby - Diet Controlled - Baby's weight 6 lbs   Hyperlipidemia    Hypertension    Impaired fasting glucose      Allergies Allergies  Allergen Reactions   Penicillins Cross Reactors Hives and Itching   Codeine Itching   Darvocet [Propoxyphene N-Acetaminophen ] Itching    Other Itching     Review of Systems Review of Systems  Constitutional: Negative.   HENT: Negative.    Respiratory: Negative.    Cardiovascular: Negative.   Gastrointestinal: Negative.   Neurological: Negative.   Psychiatric/Behavioral:  The patient has insomnia.        Objective:    Vitals BP 120/80   Pulse 94   Temp 97.8 F (36.6 C)   Resp 18   Ht 5' 2 (1.575 m)   Wt 150 lb (68 kg)   SpO2 97%   BMI 27.44 kg/m    Physical Examination Physical Exam Constitutional:      Appearance: Normal appearance.  Cardiovascular:     Rate and Rhythm: Normal rate and regular rhythm.     Heart sounds: Normal heart sounds.  Pulmonary:     Effort: Pulmonary effort is normal.     Breath sounds: Normal breath sounds.  Abdominal:     General: Bowel sounds are normal.     Palpations: Abdomen is soft.  Neurological:     General: No focal deficit present.     Mental Status: She is alert and oriented  to person, place, and time.        Assessment & Plan:   Abnormal liver function I will do test for fatty liver disease  Hypertension   Her blood pressure is well controlled.  Asthma   Her asthma is also better.  Dyslipidemia   Her 10 year cardiovascular risk is 4.8%.  She will continue to watch her diet and to exercise.  I will start her on fenofibrate  for elevated triglyceride level.  Borderline diabetes mellitus   She will continue with metformin  and watch her diet.    Return in about 3 months (around 12/19/2023).   Roetta Dare, MD

## 2023-09-18 NOTE — Assessment & Plan Note (Signed)
 She will continue with metformin  and watch her diet.

## 2023-09-18 NOTE — Addendum Note (Signed)
 Addended by: Thorn Demas on: 09/18/2023 04:20 PM   Modules accepted: Orders

## 2023-09-18 NOTE — Assessment & Plan Note (Signed)
 Her 10 year cardiovascular risk is 4.8%.  She will continue to watch her diet and to exercise.  I will start her on fenofibrate  for elevated triglyceride level.

## 2023-09-18 NOTE — Assessment & Plan Note (Signed)
 Her asthma is also better.

## 2023-09-22 ENCOUNTER — Ambulatory Visit: Admitting: Internal Medicine

## 2023-10-09 ENCOUNTER — Ambulatory Visit: Admitting: Internal Medicine

## 2023-10-09 VITALS — BP 128/78 | HR 99 | Temp 98.0°F | Resp 18 | Ht 62.0 in | Wt 147.4 lb

## 2023-10-09 DIAGNOSIS — R200 Anesthesia of skin: Secondary | ICD-10-CM | POA: Diagnosis not present

## 2023-10-09 NOTE — Progress Notes (Signed)
   Acute Office Visit  Subjective:     Patient ID: Melanie Webb, female    DOB: Aug 14, 1965, 58 y.o.   MRN: [id number]  Chief Complaint  Patient presents with   office visit    Burning and tingling sensation in leg and arm     HPI Patient is in today for numbness and burning in her left lateral leg that was started  few month ago. Per patient she just ignore that but recently she noticed that the she started having numbness in her left arm also from elbow to left hand.  She says that pain is there all the time and she feels that it is progressively getting worse.  It cause burning pain and sometime she wake up at nighttime because of this burning pain she denies having any weakness, no headache. and left arm.  She denies any incontinence of urine or bowel.  No headache.    She has borderline diabetes and she takes metformin  for that.   Review of Systems  Constitutional: Negative.   Respiratory: Negative.    Cardiovascular: Negative.   Neurological:  Positive for tingling and sensory change.         Left lateral leg numbness and now on left arm numbness.        Objective:    BP 128/78   Pulse 99   Temp 98 F (36.7 C)   Resp 18   Ht 5' 2 (1.575 m)   Wt 147 lb 6 oz (66.8 kg)   SpO2 98%   BMI 26.96 kg/m    Physical Exam Constitutional:      Appearance: Normal appearance.  HENT:     Head: Normocephalic and atraumatic.  Cardiovascular:     Rate and Rhythm: Normal rate and regular rhythm.     Heart sounds: Normal heart sounds.  Neurological:     Mental Status: She is alert.     Cranial Nerves: Cranial nerve deficit present.     Sensory: Sensory deficit present.     Motor: No weakness.     Comments:   She has diminished feeling on left side as compared to right side but strength is normal .     No results found for any visits on 10/09/23.      Assessment & Plan:   Problem List Items Addressed This Visit       Other   Numbness on left side -  Primary    I am worried that her symptoms are progressively getting worse and she wake up at nighttime because of burning pain.  I will do MRI brain and also MRI of her neck and then re-evaluate.  I will also do CBC, TSH and B12 today.      Relevant Orders   Vitamin B12   TSH   MR Brain W Wo Contrast   CBC with Differential/Platelet    No orders of the defined types were placed in this encounter.   Return in about 2 weeks (around 10/23/2023).  Roetta Dare, MD

## 2023-10-09 NOTE — Assessment & Plan Note (Signed)
 I am worried that her symptoms are progressively getting worse and she wake up at nighttime because of burning pain.  I will do MRI brain and also MRI of her neck and then re-evaluate.  I will also do CBC, TSH and B12 today.

## 2023-10-12 LAB — CBC WITH DIFFERENTIAL/PLATELET

## 2023-10-12 LAB — VITAMIN B12: Vitamin B-12: 955 pg/mL (ref 232–1245)

## 2023-10-12 LAB — TSH: TSH: 0.667 u[IU]/mL (ref 0.450–4.500)

## 2023-10-13 ENCOUNTER — Ambulatory Visit: Payer: Self-pay

## 2023-10-14 ENCOUNTER — Encounter: Payer: Self-pay | Admitting: Internal Medicine

## 2023-10-16 ENCOUNTER — Other Ambulatory Visit: Payer: Self-pay | Admitting: Internal Medicine

## 2023-10-16 DIAGNOSIS — R200 Anesthesia of skin: Secondary | ICD-10-CM

## 2023-10-16 NOTE — Progress Notes (Signed)
 She has progressive worsening of numbness left leg and now left arm. I have ordered MRI neck and brain. I will refer her to see neurologist in [facility].

## 2023-10-20 ENCOUNTER — Encounter: Payer: Self-pay | Admitting: Internal Medicine

## 2023-10-28 ENCOUNTER — Ambulatory Visit: Admitting: Internal Medicine

## 2023-11-02 ENCOUNTER — Encounter: Payer: Self-pay | Admitting: Internal Medicine

## 2023-11-04 ENCOUNTER — Ambulatory Visit: Admitting: Internal Medicine

## 2023-11-04 VITALS — BP 120/70 | HR 91 | Temp 97.9°F | Resp 18 | Ht 62.0 in | Wt 150.0 lb

## 2023-11-04 DIAGNOSIS — R200 Anesthesia of skin: Secondary | ICD-10-CM

## 2023-11-04 DIAGNOSIS — M79672 Pain in left foot: Secondary | ICD-10-CM | POA: Diagnosis not present

## 2023-11-04 MED ORDER — LOSARTAN POTASSIUM 100 MG PO TABS: 100.0000 mg | ORAL_TABLET | Freq: Every day | ORAL | 4 refills | Status: DC

## 2023-11-04 NOTE — Assessment & Plan Note (Signed)
 Her symptoms have resolved.  Will continue to monitor.

## 2023-11-04 NOTE — Assessment & Plan Note (Signed)
 I have a suggested to wear comfortable shoes and she can put in so in her left foot.  For pain she can take 2 Aleve as needed.  If pain persist then I will refer to see for doctor.

## 2023-11-04 NOTE — Progress Notes (Addendum)
   Office Visit  Subjective   Patient ID: Melanie Webb   DOB: 1965/04/20   Age: 58 y.o.   MRN: [id number]   Chief Complaint Chief Complaint  Patient presents with   office visit    Review symptoms before MRI     History of Present Illness   58 years old female who is here to discuss numbness in her left side.  Because of persistent numbness I have ordered MRI but she says that it is too expensive for her.  I have referred her to see a neurologist but she says that she has not heard back from them.  But she says that her numbness in her left side has resolved.  Sometime she get numbness in her left lower leg but it only happened once in a while.  So she agrees not to go for MRI and as her symptoms are resolved so she will not follow up with neurologist for now.    She is also complaining of pain in her left forefeet.  He says that few days ago due it was really hurting.  No pain today.  Past Medical History Past Medical History:  Diagnosis Date   Allergy    Anxiety    Asthma    Allergic Triggers (Animals)    Depression    Gestational diabetes    First Baby - Diet Controlled - Baby's weight 6 lbs   Hyperlipidemia    Hypertension    Impaired fasting glucose      Allergies Allergies  Allergen Reactions   Penicillins Cross Reactors Hives and Itching   Codeine Itching   Darvocet [Propoxyphene N-Acetaminophen ] Itching   Other Itching     Review of Systems Review of Systems  Constitutional: Negative.   Musculoskeletal:        Foot pain  Neurological:  Positive for tingling and sensory change.       Objective:    Vitals BP 120/70   Pulse 91   Temp 97.9 F (36.6 C)   Resp 18   Ht 5' 2 (1.575 m)   Wt 150 lb (68 kg)   SpO2 98%   BMI 27.44 kg/m    Physical Examination Physical Exam Constitutional:      Appearance: Normal appearance.  Cardiovascular:     Rate and Rhythm: Normal rate and regular rhythm.     Heart sounds: Normal heart sounds.   Musculoskeletal:        General: No swelling or tenderness.  Neurological:     General: No focal deficit present.     Mental Status: She is alert and oriented to person, place, and time.        Assessment & Plan:   Numbness on left side   Her symptoms have resolved.  Will continue to monitor.  Left foot pain   I have a suggested to wear comfortable shoes and she can put in so in her left foot.  For pain she can take 2 Aleve as needed.  If pain persist then I will refer to see for doctor.    No follow-ups on file.   Roetta Dare, MD

## 2023-11-08 ENCOUNTER — Ambulatory Visit (HOSPITAL_BASED_OUTPATIENT_CLINIC_OR_DEPARTMENT_OTHER)

## 2023-11-08 ENCOUNTER — Ambulatory Visit (HOSPITAL_BASED_OUTPATIENT_CLINIC_OR_DEPARTMENT_OTHER): Payer: Self-pay

## 2023-11-20 ENCOUNTER — Ambulatory Visit: Admitting: Internal Medicine

## 2023-12-17 ENCOUNTER — Emergency Department (HOSPITAL_BASED_OUTPATIENT_CLINIC_OR_DEPARTMENT_OTHER): Admission: EM | Admit: 2023-12-17 | Discharge: 2023-12-17 | Disposition: A | Discharge status: Home / Self Care

## 2023-12-17 ENCOUNTER — Other Ambulatory Visit: Payer: Self-pay

## 2023-12-17 ENCOUNTER — Encounter (HOSPITAL_BASED_OUTPATIENT_CLINIC_OR_DEPARTMENT_OTHER): Payer: Self-pay

## 2023-12-17 DIAGNOSIS — H10211 Acute toxic conjunctivitis, right eye: Secondary | ICD-10-CM | POA: Diagnosis not present

## 2023-12-17 DIAGNOSIS — T594X1A Toxic effect of chlorine gas, accidental (unintentional), initial encounter: Secondary | ICD-10-CM | POA: Insufficient documentation

## 2023-12-17 MED ORDER — FLUORESCEIN SODIUM 1 MG OP STRP
1.0000 | ORAL_STRIP | Freq: Once | OPHTHALMIC | Status: AC
  Administered 2023-12-17: 1 via OPHTHALMIC
  Filled 2023-12-17: qty 1

## 2023-12-17 MED ORDER — ERYTHROMYCIN 5 MG/GM OP OINT
TOPICAL_OINTMENT | Freq: Three times a day (TID) | OPHTHALMIC | Status: DC
  Filled 2023-12-17: qty 3.5

## 2023-12-17 MED ORDER — TETRACAINE HCL 0.5 % OP SOLN
1.0000 [drp] | Freq: Once | OPHTHALMIC | Status: AC
  Administered 2023-12-17: 1 [drp] via OPHTHALMIC
  Filled 2023-12-17: qty 4

## 2023-12-17 NOTE — ED Provider Notes (Signed)
 [facility] Provider Note   CSN: [id number] Arrival date & time: 12/17/23  2002     Patient presents with: Eye Pain (Right eye)   Melanie Webb is a 58 y.o. female.   Patient to ED after splashing bleach into the right eye. Incident occurred about 1.5 hours ago. She immediately got into the shower and irrigated the eye for an estimated 15 minutes. She also went to Urgent Care where further irrigation was done. She reports the eye continues to feel irritated but her vision is normal. No other injury.   The history is provided by the patient. No language interpreter was used.  Eye Pain       Prior to Admission medications   Medication Sig Start Date End Date Taking? Authorizing Provider  albuterol  (PROVENTIL  HFA;VENTOLIN  HFA) 108 (90 BASE) MCG/ACT inhaler Inhale 2 puffs into the lungs every 6 (six) hours as needed for wheezing. 08/05/13 12/23/22  Zanard, Robyn K, MD  albuterol  (VENTOLIN  HFA) 108 (90 Base) MCG/ACT inhaler Inhale 2 puffs into the lungs every 6 (six) hours as needed. 04/03/15   [provider]  famotidine  (PEPCID ) 20 MG tablet Take 1 tablet (20 mg total) by mouth 2 (two) times daily. 06/30/23 06/29/24  Amin, Saad, MD  fenofibrate  (TRICOR ) 145 MG tablet Take 1 tablet (145 mg total) by mouth daily. 09/18/23 09/17/24  Amin, Saad, MD  lisdexamfetamine (VYVANSE ) 20 MG capsule Take 1 capsule (20 mg total) by mouth 2 (two) times daily. 05/05/23   Amin, Saad, MD  losartan  (COZAAR ) 100 MG tablet Take 1 tablet (100 mg total) by mouth daily. 11/04/23   Amin, Saad, MD  melatonin 5 MG TABS Take 1 tablet (5 mg total) by mouth at bedtime. Patient not taking: Reported on 09/18/2023 03/13/22   Amin, Saad, MD  metFORMIN  (GLUCOPHAGE -XR) 500 MG 24 hr tablet TAKE TWO (2) TABLETS BY MOUTH EVERY MORNING WITH BREAKFAST 07/17/23   Amin, Saad, MD  traZODone  (DESYREL ) 50 MG tablet Take 1 tablet (50 mg total) by mouth at bedtime. 09/18/23   Amin, Saad,  MD    Allergies: Penicillins cross reactors, Codeine, Darvocet [propoxyphene n-acetaminophen ], and Other    Review of Systems  Eyes:  Positive for pain.    Updated Vital Signs BP (!) 142/65 (BP Location: Right Arm)   Pulse 91   Temp 98.3 F (36.8 C) (Oral)   Resp 18   SpO2 98%   Physical Exam Vitals and nursing note reviewed.  Constitutional:      General: She is not in acute distress.    Appearance: She is well-developed. She is not ill-appearing.  Eyes:     Extraocular Movements: Extraocular movements intact.     Pupils: Pupils are equal, round, and reactive to light.     Comments: Conjunctiva on right is erythematous without swelling or chemosis. Sclera clear. No fluorescein uptake to suggest abrasion or ulceration. PH 7.0, considered WNL.   Pulmonary:     Effort: Pulmonary effort is normal.  Musculoskeletal:        General: Normal range of motion.     Cervical back: Normal range of motion.  Skin:    General: Skin is warm and dry.  Neurological:     Mental Status: She is alert and oriented to person, place, and time.     (all labs ordered are listed, but only abnormal results are displayed) Labs Reviewed - No data to display  EKG: None  Radiology: No  results found.   Procedures   Medications Ordered in the ED  erythromycin ophthalmic ointment (has no administration in time range)  tetracaine (PONTOCAINE) 0.5 % ophthalmic solution 1 drop (1 drop Right Eye Given 12/17/23 2023)  fluorescein ophthalmic strip 1 strip (1 strip Right Eye Given 12/17/23 2023)    Clinical Course as of 12/17/23 2046  Thu Dec 17, 2023  2039 Patient to ED after bleach exposure into right eye about 1 1/2 hours prior to arrival. She denies visual change. Eye appears irritated without corneal injury. No lid swelling. PH normal.   Will cover with erythromycin ointment. REfer to ophthalmology for re-examination in the morning.  [SU]    Clinical Course User Index [SU] Odell Balls,  PA-C                                 Medical Decision Making Risk Prescription drug management.        Final diagnoses:  Chemical conjunctivitis of right eye    ED Discharge Orders     None          Odell Balls RIGGERS 12/17/23 2046    Ula Prentice SAUNDERS, MD 12/17/23 2230

## 2023-12-17 NOTE — ED Notes (Signed)
 Left eye 20/40 Right eye 20/40  Both eyes 20/32

## 2023-12-17 NOTE — Discharge Instructions (Signed)
 Use the ointment 3 times daily. This is to prevent infection and also will be soothing to the eye.   Please follow up with Dr. Charmayne tomorrow by calling the office and letting them know you were referred by the ED for recheck of chemical insult to the right eye.   Return to the ED with any new or concerning symptoms.

## 2023-12-17 NOTE — ED Triage Notes (Signed)
 POV, pt was cleaning and got splashed with Bleach 7% on right eye.  Pt feels irritated 4 out 10, feels like it has been scratched. UC staff rinsed eye for 15 min.    Pt was seen on [facility], was sent here due lack of Ph strips.

## 2023-12-29 ENCOUNTER — Encounter: Payer: Self-pay | Admitting: Internal Medicine

## 2023-12-29 ENCOUNTER — Ambulatory Visit: Admitting: Internal Medicine

## 2023-12-29 VITALS — BP 128/78 | HR 83 | Temp 97.0°F | Resp 18 | Ht 62.0 in | Wt 150.0 lb

## 2023-12-29 DIAGNOSIS — E559 Vitamin D deficiency, unspecified: Secondary | ICD-10-CM | POA: Diagnosis not present

## 2023-12-29 DIAGNOSIS — Z0000 Encounter for general adult medical examination without abnormal findings: Secondary | ICD-10-CM | POA: Diagnosis not present

## 2023-12-29 DIAGNOSIS — Z23 Encounter for immunization: Secondary | ICD-10-CM

## 2023-12-29 DIAGNOSIS — G4733 Obstructive sleep apnea (adult) (pediatric): Secondary | ICD-10-CM | POA: Diagnosis not present

## 2023-12-29 DIAGNOSIS — F411 Generalized anxiety disorder: Secondary | ICD-10-CM

## 2023-12-29 DIAGNOSIS — I10 Essential (primary) hypertension: Secondary | ICD-10-CM

## 2023-12-29 DIAGNOSIS — E785 Hyperlipidemia, unspecified: Secondary | ICD-10-CM

## 2023-12-29 DIAGNOSIS — R41840 Attention and concentration deficit: Secondary | ICD-10-CM

## 2023-12-29 DIAGNOSIS — Z124 Encounter for screening for malignant neoplasm of cervix: Secondary | ICD-10-CM | POA: Insufficient documentation

## 2023-12-29 DIAGNOSIS — G4700 Insomnia, unspecified: Secondary | ICD-10-CM

## 2023-12-29 DIAGNOSIS — R7303 Prediabetes: Secondary | ICD-10-CM

## 2023-12-29 DIAGNOSIS — R945 Abnormal results of liver function studies: Secondary | ICD-10-CM

## 2023-12-29 MED ORDER — TETANUS-DIPHTHERIA TOXOIDS TD 2-2 LF/0.5ML IM SUSP: 0.5000 mL | Freq: Once | INTRAMUSCULAR | 0 refills | Status: AC

## 2023-12-29 MED ORDER — LISDEXAMFETAMINE DIMESYLATE 20 MG PO CAPS: 20.0000 mg | ORAL_CAPSULE | Freq: Two times a day (BID) | ORAL | 0 refills | Status: AC

## 2023-12-29 MED ORDER — LOSARTAN POTASSIUM 100 MG PO TABS: 100.0000 mg | ORAL_TABLET | Freq: Every day | ORAL | 4 refills | Status: AC

## 2023-12-29 MED ORDER — ESTRADIOL 0.01 % VA CREA: 1.0000 | TOPICAL_CREAM | Freq: Every day | VAGINAL | 0 refills | Status: AC

## 2023-12-29 MED ORDER — TRAZODONE HCL 50 MG PO TABS: 50.0000 mg | ORAL_TABLET | Freq: Every day | ORAL | 3 refills | Status: AC

## 2023-12-29 MED ORDER — METFORMIN HCL ER 500 MG PO TB24: 500.0000 mg | ORAL_TABLET | Freq: Every day | ORAL | 3 refills | Status: AC

## 2023-12-29 NOTE — Assessment & Plan Note (Signed)
 She has been taking vitamin-D 5000 units daily and I have told her to take once a week now.

## 2023-12-29 NOTE — Assessment & Plan Note (Signed)
 She has a mild sleep apnea and do not use any CPAP.

## 2023-12-29 NOTE — Assessment & Plan Note (Signed)
 She has she has fatty liver disease I have suggested to do liver ultrasound as well.

## 2023-12-29 NOTE — Progress Notes (Addendum)
 Office Visit  Subjective   Patient ID: Melanie Webb   DOB: 01-27-1966   Age: 58 y.o.   MRN: [id number]   Chief Complaint Chief Complaint  Patient presents with   Annual Exam    Annual exam       History of Present Illness 58 years old female is here for annual physical exam, she is single,  she has boyfriend. She does not smoke, she occasionally drink.   She does not use any recreational drugs.     She is independent in all activities of daily living.  She works as a engineer, site.  She denies any fall.  She get flu shot every year and got it today. She has pneumonia vaccine. She think she has 2 shingle vaccine. She think her tetanus booster was 10 years.  She get mammogram every year, she got in July 2025. She has colonoscopy many years ago but has been doing stool kit. Her pap smear was 3 years ago.  Her mother has breast cancer in her 61's. Her cousine also has breast cancer, her materanl aunt has stomach cancer.   She has hypertension, She can check her blood pressure at home but she normally does not.  Her blood pressure is well controlled.  She takes losartan  100 mg daily.  She is asking for 90 day supply of this medicine.    She also has borderline diabetes.  She takes metformin  and her hemoglobin A1c was 6.0 but on last blood draw hemoglobin A1c was 5.6.  She does not do much exercise but she watch her diet.    She also has dyslipidemia and she could not tolerate statin.  Currently she takes fenofibrate  and her triglyceride was high.  She is fasting for lipid panel today.    She has anxiety and ADHD for that she follows with online psychiatrist.  She has been getting escitalopram 10 mg daily and Vyvanse  from them.  She is asking if I can prescribe these medication for her.  Per patient and these helped her to stay focus.       She is complaining of painful intercourse with her boyfriend.  She says that she has been using water-soluble lubricant but that is not  helping.    She also has abnormal liver function and she tells me that her mother has fatty liver disease.  She very occasionally drink alcohol.  Her hepatitis panel was negative.   Past Medical History Past Medical History:  Diagnosis Date   Allergy    Anxiety    Asthma    Allergic Triggers (Animals)    Depression    Gestational diabetes    First Baby - Diet Controlled - Baby's weight 6 lbs   Hyperlipidemia    Hypertension    Impaired fasting glucose      Allergies Allergies  Allergen Reactions   Penicillins Cross Reactors Hives and Itching   Codeine Itching   Darvocet [Propoxyphene N-Acetaminophen ] Itching   Other Itching     Review of Systems Review of Systems  Constitutional: Negative.   HENT: Negative.    Respiratory: Negative.    Cardiovascular: Negative.   Gastrointestinal: Negative.   Neurological: Negative.        Objective:    Vitals BP 128/78   Pulse 83   Temp (!) 97 F (36.1 C)   Resp 18   Ht 5' 2 (1.575 m)   Wt 150 lb (68 kg)   SpO2 99%  BMI 27.44 kg/m    Physical Examination Physical Exam Constitutional:      Appearance: Normal appearance.  HENT:     Head: Normocephalic and atraumatic.  Eyes:     Extraocular Movements: Extraocular movements intact.     Pupils: Pupils are equal, round, and reactive to light.  Cardiovascular:     Rate and Rhythm: Normal rate and regular rhythm.     Heart sounds: Normal heart sounds.  Pulmonary:     Effort: Pulmonary effort is normal.     Breath sounds: Normal breath sounds.  Abdominal:     General: Bowel sounds are normal.     Palpations: Abdomen is soft.  Neurological:     General: No focal deficit present.     Mental Status: She is alert and oriented to person, place, and time.        Assessment & Plan:   Hypertension Her blood pressure is well controlled.  OSA on CPAP   She has a mild sleep apnea and do not use any CPAP.  Vitamin D  deficiency   She has been taking vitamin-D 5000  units daily and I have told her to take once a week now.  Insomnia   She takes trazodone  50 mg and sometimes she has to add melatonin  Dyslipidemia  She could not tolerate statin but she take fenofibrate  and she is fasting for lipid panel today.  Borderline diabetes mellitus   She has borderline diabetes and she takes metformin  500 mg daily.  Her hemoglobin A1c was 5.6 last time.  Attention and concentration deficit   She follows with psychiatrist and take Vyvanse  that is helping her.  She is asking if I can prescribe that medicine for her.  Anxiety state   She take escitalopram 10 mg daily.  She also get this medicine from psychiatrist.  Abnormal liver function   She has she has fatty liver disease I have suggested to do liver ultrasound as well.    Return in about 3 months (around 03/30/2024).   Roetta Dare, MD

## 2023-12-29 NOTE — Assessment & Plan Note (Signed)
 She takes trazodone  50 mg and sometimes she has to add melatonin

## 2023-12-29 NOTE — Addendum Note (Signed)
 Addended by: Jaylon Boylen on: 12/29/2023 12:19 PM   Modules accepted: Orders

## 2023-12-29 NOTE — Assessment & Plan Note (Signed)
 She has borderline diabetes and she takes metformin  500 mg daily.  Her hemoglobin A1c was 5.6 last time.

## 2023-12-29 NOTE — Assessment & Plan Note (Signed)
 Her blood pressure is well controlled.

## 2023-12-29 NOTE — Assessment & Plan Note (Signed)
 She could not tolerate statin but she take fenofibrate  and she is fasting for lipid panel today.

## 2023-12-29 NOTE — Assessment & Plan Note (Signed)
 She is due for tetanus booster.  I will send the prescription to pharmacy.  She will find out about shingles vaccine.  I have also given her kit for stool test.

## 2023-12-29 NOTE — Assessment & Plan Note (Signed)
 She follows with psychiatrist and take Vyvanse  that is helping her.  She is asking if I can prescribe that medicine for her.

## 2023-12-29 NOTE — Assessment & Plan Note (Signed)
 She take escitalopram 10 mg daily.  She also get this medicine from psychiatrist.

## 2023-12-30 LAB — CMP14 + ANION GAP
ALT: 36 IU/L — ABNORMAL HIGH (ref 0–32)
AST: 31 IU/L (ref 0–40)
Albumin: 4.6 g/dL (ref 3.8–4.9)
Alkaline Phosphatase: 65 IU/L (ref 49–135)
Anion Gap: 16 mmol/L (ref 10.0–18.0)
BUN/Creatinine Ratio: 22 (ref 9–23)
BUN: 19 mg/dL (ref 6–24)
Bilirubin Total: 0.2 mg/dL (ref 0.0–1.2)
CO2: 22 mmol/L (ref 20–29)
Calcium: 9.7 mg/dL (ref 8.7–10.2)
Chloride: 101 mmol/L (ref 96–106)
Creatinine, Ser: 0.86 mg/dL (ref 0.57–1.00)
Globulin, Total: 2.4 g/dL (ref 1.5–4.5)
Glucose: 104 mg/dL — ABNORMAL HIGH (ref 70–99)
Potassium: 4.7 mmol/L (ref 3.5–5.2)
Sodium: 139 mmol/L (ref 134–144)
Total Protein: 7 g/dL (ref 6.0–8.5)
eGFR: 78 mL/min/1.73 (ref >59)

## 2023-12-30 LAB — HEMOGLOBIN A1C
Est. average glucose Bld gHb Est-mCnc: 117 mg/dL
Hgb A1c MFr Bld: 5.7 % — ABNORMAL HIGH (ref 4.8–5.6)

## 2023-12-30 LAB — CBC WITH DIFFERENTIAL/PLATELET
Basophils Absolute: 0 x10E3/uL (ref 0.0–0.2)
Basos: 0 %
EOS (ABSOLUTE): 0.1 x10E3/uL (ref 0.0–0.4)
Eos: 2 %
Hematocrit: 40.7 % (ref 34.0–46.6)
Hemoglobin: 13.6 g/dL (ref 11.1–15.9)
Immature Grans (Abs): 0 x10E3/uL (ref 0.0–0.1)
Immature Granulocytes: 0 %
Lymphocytes Absolute: 2.3 x10E3/uL (ref 0.7–3.1)
Lymphs: 36 %
MCH: 29.5 pg (ref 26.6–33.0)
MCHC: 33.4 g/dL (ref 31.5–35.7)
MCV: 88 fL (ref 79–97)
Monocytes Absolute: 0.4 x10E3/uL (ref 0.1–0.9)
Monocytes: 5 %
Neutrophils Absolute: 3.7 x10E3/uL (ref 1.4–7.0)
Neutrophils: 57 %
Platelets: 297 x10E3/uL (ref 150–450)
RBC: 4.61 x10E6/uL (ref 3.77–5.28)
RDW: 13 % (ref 11.7–15.4)
WBC: 6.5 x10E3/uL (ref 3.4–10.8)

## 2023-12-30 LAB — LIPID PANEL
Chol/HDL Ratio: 3.4 ratio (ref 0.0–4.4)
Cholesterol, Total: 210 mg/dL — ABNORMAL HIGH (ref 100–199)
HDL: 62 mg/dL (ref >39)
LDL Chol Calc (NIH): 124 mg/dL — ABNORMAL HIGH (ref 0–99)
Triglycerides: 139 mg/dL (ref 0–149)
VLDL Cholesterol Cal: 24 mg/dL (ref 5–40)

## 2023-12-30 LAB — VITAMIN D 25 HYDROXY (VIT D DEFICIENCY, FRACTURES): Vit D, 25-Hydroxy: 45.5 ng/mL (ref 30.0–100.0)

## 2023-12-30 LAB — TSH: TSH: 1.37 u[IU]/mL (ref 0.450–4.500)

## 2023-12-31 ENCOUNTER — Ambulatory Visit: Payer: Self-pay | Admitting: Internal Medicine

## 2024-01-01 LAB — IGP,CTNG,RFXAPTHPV,RFX16/18,45
Chlamydia, Nuc. Acid Amp: NEGATIVE
Gonococcus by Nucleic Acid Amp: NEGATIVE
PAP Smear Comment: 0

## 2024-01-05 ENCOUNTER — Telehealth (HOSPITAL_BASED_OUTPATIENT_CLINIC_OR_DEPARTMENT_OTHER): Payer: Self-pay

## 2024-01-06 ENCOUNTER — Ambulatory Visit: Admitting: Internal Medicine

## 2024-01-06 DIAGNOSIS — K921 Melena: Secondary | ICD-10-CM

## 2024-01-06 LAB — HEMOCCULT GUIAC POC 1CARD (OFFICE)
Card #2 Fecal Occult Blod, POC: NEGATIVE
Card #3 Fecal Occult Blood, POC: NEGATIVE
Fecal Occult Blood, POC: NEGATIVE

## 2024-01-06 NOTE — Progress Notes (Signed)
 Patient called.  Patient aware.   Dr caleen : Her labs are ok, actually better. Pap smear is unsatisfactory test so will repeat it next year

## 2024-01-07 NOTE — Progress Notes (Signed)
   Established Patient Office Visit  Subjective   Patient ID: Kaysa Roulhac, female    DOB: 1965/10/11  Age: 58 y.o. MRN: [id number]  Chief Complaint  Patient presents with   nurse visit    Hemoccult card     HPI    ROS    Objective:     There were no vitals taken for this visit.   Physical Exam   Results for orders placed or performed in visit on 01/06/24  POCT occult blood stool  Result Value Ref Range   Fecal Occult Blood, POC Negative Negative   Card #1 Date 12/30/23    Card #2 Fecal Occult Blod, POC Negative    Card #2 Date 12/31/23    Card #3 Fecal Occult Blood, POC Negative    Card #3 Date 01/01/24       The 10-year ASCVD risk score (Arnett DK, et al., 2019) is: 3.4%    Assessment & Plan:   Problem List Items Addressed This Visit   None Visit Diagnoses       Blood in stool    -  Primary   Relevant Orders   POCT occult blood stool (Completed)       No follow-ups on file.    Indea Dearman

## 2024-01-13 ENCOUNTER — Ambulatory Visit: Payer: Self-pay

## 2024-01-13 ENCOUNTER — Encounter: Payer: Self-pay | Admitting: Internal Medicine

## 2024-01-13 NOTE — Progress Notes (Signed)
 Patient called.  Patient aware. Her stool test is negative

## 2024-01-18 ENCOUNTER — Other Ambulatory Visit

## 2024-01-20 NOTE — Telephone Encounter (Signed)
Message given to dr Nelson Chimes

## 2024-01-28 ENCOUNTER — Ambulatory Visit: Admitting: Nurse Practitioner

## 2024-01-31 ENCOUNTER — Encounter: Payer: Self-pay | Admitting: Internal Medicine

## 2024-02-01 ENCOUNTER — Ambulatory Visit

## 2024-02-01 DIAGNOSIS — R945 Abnormal results of liver function studies: Secondary | ICD-10-CM

## 2024-02-03 ENCOUNTER — Ambulatory Visit: Admitting: Internal Medicine

## 2024-02-03 ENCOUNTER — Encounter: Payer: Self-pay | Admitting: Internal Medicine

## 2024-02-03 VITALS — BP 118/78 | HR 86 | Temp 97.7°F | Resp 18 | Ht 62.0 in | Wt 154.2 lb

## 2024-02-03 DIAGNOSIS — K760 Fatty (change of) liver, not elsewhere classified: Secondary | ICD-10-CM | POA: Insufficient documentation

## 2024-02-03 NOTE — Progress Notes (Signed)
° °  Office Visit  Subjective   Patient ID: Melanie Webb   DOB: 1965/09/05   Age: 58 y.o.   MRN: [id number]   Chief Complaint Chief Complaint  Patient presents with   Office visit    Ultrasound results     History of Present Illness 58 years old female is here to discuss liver ultrasound result. Her mother has fatty liver disease, her brother has cirrhosis of liver but he was alcoholic.   She has concern about her family history and having non-alcoholic fatty liver disease.  She does not drink.  Her liver function has been normal up to last year.  She admits that she was doing regular exercise but she did not do much exercise this year because she is under lot of stress at work.     Past Medical History Past Medical History:  Diagnosis Date   Allergy    Anxiety    Asthma    Allergic Triggers (Animals)    Depression    Gestational diabetes    First Baby - Diet Controlled - Baby's weight 6 lbs   Hyperlipidemia    Hypertension    Impaired fasting glucose      Allergies Allergies[1]   Review of Systems Review of Systems  Constitutional: Negative.   HENT: Negative.    Respiratory: Negative.    Cardiovascular: Negative.   Gastrointestinal: Negative.   Neurological: Negative.   Psychiatric/Behavioral:  The patient is nervous/anxious.        Objective:    Vitals BP 118/78   Pulse 86   Temp 97.7 F (36.5 C)   Resp 18   Ht 5' 2 (1.575 m)   Wt 154 lb 4 oz (70 kg)   SpO2 98%   BMI 28.21 kg/m    Physical Examination Physical Exam Constitutional:      Appearance: Normal appearance.  Cardiovascular:     Rate and Rhythm: Normal rate and regular rhythm.     Heart sounds: Normal heart sounds.  Neurological:     General: No focal deficit present.     Mental Status: She is alert and oriented to person, place, and time.        Assessment & Plan:   Non-alcoholic fatty liver disease   She will do 30 minutes exercise at least 3 times a week.  She will  also take vitamin-E 800 units daily.  Will repeat liver function in 6 months to see if that makes a difference.      Follow-up with regular appointment.  Roetta Dare, MD      [1]  Allergies Allergen Reactions   Penicillins Cross Reactors Hives and Itching   Codeine Itching   Darvocet [Propoxyphene N-Acetaminophen ] Itching   Other Itching

## 2024-02-03 NOTE — Assessment & Plan Note (Signed)
 She will do 30 minutes exercise at least 3 times a week.  She will also take vitamin-E 800 units daily.  Will repeat liver function in 6 months to see if that makes a difference.

## 2024-02-04 ENCOUNTER — Ambulatory Visit: Admitting: Nurse Practitioner

## 2024-02-04 ENCOUNTER — Encounter: Payer: Self-pay | Admitting: Nurse Practitioner

## 2024-02-04 VITALS — BP 136/62 | HR 91 | Resp 22 | Ht 62.0 in | Wt 151.2 lb

## 2024-02-04 DIAGNOSIS — F419 Anxiety disorder, unspecified: Secondary | ICD-10-CM

## 2024-02-04 DIAGNOSIS — F331 Major depressive disorder, recurrent, moderate: Secondary | ICD-10-CM | POA: Diagnosis not present

## 2024-02-04 DIAGNOSIS — F900 Attention-deficit hyperactivity disorder, predominantly inattentive type: Secondary | ICD-10-CM | POA: Diagnosis not present

## 2024-02-04 NOTE — Assessment & Plan Note (Addendum)
 Melanie Webb

## 2024-02-04 NOTE — Assessment & Plan Note (Addendum)
 [name]

## 2024-02-04 NOTE — Progress Notes (Signed)
 "  Subjective:  I need my psychiatric medications managed by a psychiatric provider.    HPI: Melanie Webb is a 58 y.o. female presenting on 02/04/2024 in office for psychiatric evaluation and medication management.  Patient carries a diagnosis of MDD, GAD, and ADHD for which she is currently on Lexapro 5 mg and Vyvanse  20 mg bid. She reports she is doing well on the Lexapro with decreased anxiety and depressive symptoms. 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 She teaches high school Spanish. 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  She is currently on Vyvanse  20 mg qam at 6-7:30 am and at 11 am and it usually works pretty well for her but lately things have been more challenging for her and has been spending weekends catching up on her work.  She currently lives with one female roommate, who is in her 62's. Her brother age 20, who recently moved in with her. She has been divorced for 11 year and has a boyfriend of 10 years, but they don't live together. She has 2 children, daughter 4 and son 39. She doesn't smoke or drink alcohol. She reports she takes Trazodone  50 mg po at bedtime prn for anxiety/sleep and works well for her when needed.   ROS: Negative unless specifically indicated above in HPI.   Relevant past medical history reviewed and updated as indicated.   Allergies and medications reviewed and updated.   Current Outpatient Medications  Medication Instructions   albuterol  (PROVENTIL  HFA;VENTOLIN  HFA) 108 (90 BASE) MCG/ACT inhaler 2 puffs, Inhalation, Every 6 hours PRN   albuterol  (VENTOLIN  HFA) 108 (90 Base) MCG/ACT inhaler 2 puffs, Every 6 hours PRN   buPROPion (WELLBUTRIN XL) 150 mg, Every morning   escitalopram (LEXAPRO) 10 mg, Daily   estradiol   (ESTRACE ) 0.01 % CREA vaginal cream 1 Applicatorful, Vaginal, Daily at bedtime   famotidine  (PEPCID ) 20 mg, Oral, 2 times daily   fenofibrate  (TRICOR ) 145 mg, Oral, Daily   guanFACINE (INTUNIV) 1 mg, Daily   Lexapro 5 mg, Daily   lisdexamfetamine  (VYVANSE ) 20 mg, Oral, 2 times daily   losartan  (COZAAR ) 100 mg, Oral, Daily   melatonin 5 mg, Oral, Daily at bedtime   metFORMIN  (GLUCOPHAGE -XR) 500 mg, Oral, Daily with breakfast   traZODone  (DESYREL ) 50 mg, Oral, Daily at bedtime     Allergies[1]  Objective:   BP 136/62 (BP Location: Left Arm, Patient Position: Sitting, Cuff Size: Normal)   Pulse 91   Resp (!) 22   Ht 5' 2 (1.575 m)   Wt 151 lb 3.2 oz (68.6 kg)   SpO2 98%   BMI 27.65 kg/m    Physical Exam Constitutional:      Appearance: Normal appearance.  HENT:     Head: Normocephalic.  Eyes:     Conjunctiva/sclera: Conjunctivae normal.  Cardiovascular:     Rate and Rhythm: Normal rate and regular rhythm.  Pulmonary:     Effort: Pulmonary effort is normal.     Breath sounds: Normal breath sounds.  Skin:    General: Skin is warm and dry.  Neurological:     General: No focal deficit present.     Mental Status: She is alert and oriented to person, place, and  time.  Psychiatric:        Mood and Affect: Mood normal.        Behavior: Behavior normal.        Thought Content: Thought content normal.        Judgment: Judgment normal.    Assessment & Plan:   Assessment & Plan Attention deficit hyperactivity disorder (ADHD), predominantly inattentive type     Moderate episode of recurrent major depressive disorder (HCC)     Anxiety     Continue Lexapro 5 mg or 10 mg po daily for depression/anxiety Continue Vyvanse  20 mg po bid (morning and noon) for ADHD Start Wellbutrin XL 150 mg po qam for adjunct therapy for ADHD Continue Trazodone  50 mg po at bedtime prn anxiety/sleep   Follow up plan: Return in about 4 weeks (around 03/03/2024).  Florencia Cousin, NP       [1]  Allergies Allergen Reactions   Penicillins Cross Reactors Hives and Itching   Codeine Itching   Darvocet [Propoxyphene N-Acetaminophen ] Itching   Other Itching   "

## 2024-02-27 ENCOUNTER — Other Ambulatory Visit: Payer: Self-pay | Admitting: Internal Medicine

## 2024-03-02 ENCOUNTER — Ambulatory Visit: Admitting: Nurse Practitioner

## 2024-03-08 ENCOUNTER — Ambulatory Visit: Admitting: Nurse Practitioner

## 2024-03-08 ENCOUNTER — Encounter: Payer: Self-pay | Admitting: Nurse Practitioner

## 2024-03-08 VITALS — BP 112/82 | HR 86 | Resp 16 | Ht 62.0 in | Wt 152.2 lb

## 2024-03-08 DIAGNOSIS — F900 Attention-deficit hyperactivity disorder, predominantly inattentive type: Secondary | ICD-10-CM

## 2024-03-08 DIAGNOSIS — F419 Anxiety disorder, unspecified: Secondary | ICD-10-CM

## 2024-03-08 DIAGNOSIS — F330 Major depressive disorder, recurrent, mild: Secondary | ICD-10-CM | POA: Diagnosis not present

## 2024-03-08 NOTE — Assessment & Plan Note (Signed)
 [name]

## 2024-03-08 NOTE — Assessment & Plan Note (Signed)
 Melanie Webb

## 2024-03-08 NOTE — Progress Notes (Signed)
 "  Subjective:  I like the new medication.    HPI: Melanie Webb is a 59 y.o. female presenting on 03/08/2024 in office for medication follow up.  Patient reports the Wellbutrin Xl 150 mg that was started last visit is helping with her focus and concentration. She mentions her insurance plan changed and she is now having to pay $75 for her Vyvanse  20 mg bid. She feels taking the Wellbutrin Xl 150 mg in the morning is helping her jump start the day and she is able to take the Vyvanse  a little bit later in the morning and the second dose a little later in the afternoon and still stay focused. She is hoping to try an increase dose of Wellbutrin Xl or a non-stimulant ADHD over the summer and take a break from the Vyvanse . She reports the Guanfacine Er 1 mg didn't do much for her and won't be taking it.  She is asking to try a different ADHD medication today on a trial basis that is longer acting. We discussed Dyanavel Xr and will start at 10 mg to see if it is more effective for her than Vyvanse  20 mg twice a day.  Other than that, she reports she is doing well on the Lexapro with improvement with decreased anxiety and depressive symptoms. She denies poor appetite, sleep issues, psychosis, delusions, suicidal or homicidal ideations.   ROS: Negative unless specifically indicated above in HPI.   Relevant past medical history reviewed and updated as indicated.   Allergies and medications reviewed and updated.   Current Outpatient Medications  Medication Instructions   albuterol  (PROVENTIL  HFA;VENTOLIN  HFA) 108 (90 BASE) MCG/ACT inhaler 2 puffs, Inhalation, Every 6 hours PRN   albuterol  (VENTOLIN  HFA) 108 (90 Base) MCG/ACT inhaler 2 puffs, Every 6 hours PRN   buPROPion (WELLBUTRIN XL) 150 mg, Every morning   escitalopram (LEXAPRO) 10 mg, Daily   estradiol  (ESTRACE ) 0.01 % CREA vaginal cream 1 Applicatorful, Vaginal, Daily at bedtime   famotidine  (PEPCID ) 20 mg, Oral, 2 times daily    fenofibrate  (TRICOR ) 145 mg, Oral, Daily   guanFACINE (INTUNIV) 1 mg, Daily   Lexapro 5 mg, Daily   lisdexamfetamine  (VYVANSE ) 20 mg, Oral, 2 times daily   losartan  (COZAAR ) 100 mg, Oral, Daily   melatonin 5 mg, Oral, Daily at bedtime   metFORMIN  (GLUCOPHAGE -XR) 500 mg, Oral, Daily with breakfast   traZODone  (DESYREL ) 50 mg, Oral, Daily at bedtime     Allergies[1]  Objective:   BP 112/82 (BP Location: Right Arm, Patient Position: Sitting, Cuff Size: Normal)   Pulse 86   Resp 16   Ht 5' 2 (1.575 m)   Wt 152 lb 3.2 oz (69 kg)   SpO2 98%   BMI 27.84 kg/m    Physical Exam Constitutional:      Appearance: Normal appearance.  HENT:     Head: Normocephalic.  Eyes:     Conjunctiva/sclera: Conjunctivae normal.  Cardiovascular:     Rate and Rhythm: Normal rate and regular rhythm.  Pulmonary:     Effort: Pulmonary effort is normal.     Breath sounds: Normal breath sounds.  Skin:    General: Skin is warm and dry.  Neurological:     General: No focal deficit present.     Mental Status: She is alert and oriented to person, place, and time.  Psychiatric:        Mood and Affect: Mood normal.        Behavior: Behavior normal.  Thought Content: Thought content normal.        Judgment: Judgment normal.    Assessment & Plan:   Assessment & Plan Attention deficit hyperactivity disorder (ADHD), predominantly inattentive type     Mild episode of recurrent major depressive disorder     Anxiety     Increase Lexapro 5 mg to 10 mg po daily for depression/anxiety Hold Vyvanse  20 mg po bid (morning and noon) for ADHD Start Dyanavel Xr 10 mg po qam for ADHD (Rx sent to [facility]) Continue Wellbutrin Xl 150 mg po qam for ADHD/mood   Follow up plan: Return in about 4 weeks (around 04/05/2024).  Florencia Cousin, NP      [1]  Allergies Allergen Reactions   Penicillins Cross Reactors Hives and Itching   Codeine Itching   Darvocet [Propoxyphene N-Acetaminophen ]  Itching   Other Itching   "

## 2024-04-05 ENCOUNTER — Ambulatory Visit: Admitting: Nurse Practitioner
